# Patient Record
Sex: Male | Born: 1940 | Race: White | Hispanic: Refuse to answer | Marital: Married | Smoking: Never smoker
Health system: Southern US, Community
[De-identification: ages and names within clinical notes are randomized; demographics above are authoritative.]

## PROBLEM LIST (undated history)

## (undated) DIAGNOSIS — C61 Malignant neoplasm of prostate: Secondary | ICD-10-CM

## (undated) DIAGNOSIS — H269 Unspecified cataract: Secondary | ICD-10-CM

## (undated) DIAGNOSIS — K635 Polyp of colon: Secondary | ICD-10-CM

## (undated) DIAGNOSIS — E7800 Pure hypercholesterolemia, unspecified: Secondary | ICD-10-CM

## (undated) HISTORY — DX: Malignant neoplasm of prostate: C61

## (undated) HISTORY — DX: Polyp of colon: K63.5

## (undated) HISTORY — DX: Pure hypercholesterolemia, unspecified: E78.00

## (undated) HISTORY — PX: COLONOSCOPY: SHX174

## (undated) HISTORY — DX: Unspecified cataract: H26.9

## (undated) HISTORY — PX: PROSTATECTOMY: SHX69

## (undated) NOTE — Nursing Note (Signed)
 Formatting of this note might be different from the original.  Discharge note at 10:39 AM  Vital signs stable, I.V. discontinued with cannula tip intact. Results of the procedure discussed, and recommendations given by EMERSON Henri M.D.   .  After-Care Visit Summary (AVS) and verbal discharge instructions reviewed with patient and wife ;  who expressed understanding of all information given. Discharged criteria met and patient was discharged.  Electronically signed by Brigido Gleda LABOR (R.N.), R.N. at 08/09/2015 10:39 AM PDT

## (undated) NOTE — Nursing Note (Signed)
 Formatting of this note might be different from the original.  Completed procedure note  See [facility] flowsheet for detailed information.     Colonoscopy Completed by Dr. Evelene with no Difficulties. Abdominal pressure was used as needed to facilitate movement of the scope. Vital signs maintained within stable range for patient.  Pathology : x2 Jars colon    Medications:   Versed IV mg: 5 MG   Demerol IVmg: 50 MG              Patient transported to post procedure area for recovery. Report given to Nic R., RN Electronically signed by Curlie Rea HAMS (R.N.), R.N. at 08/09/2015  9:24 AM PDT

## (undated) NOTE — Nursing Note (Signed)
 Formatting of this note might be different from the original. 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 )  Seizures/ Brain Injury: no Sleep Apnea or Snoring: no Anesthesia/ Sedation Problems: no Heart Disease: no Lung Disease/ Smoker: no Liver Disease: no Kidney Disease: no Diabetes: no Cancer: yes Bleeding Disorder: no Pregnant or Breast-Feeding: no  PHYSICAL ASSESSMENT 69 year old  Ambulatory (age appropriate): yes Teeth Intact (not loose): yes Neurologic: Alert, age appropriate yes Heart Rate / Pulse  Regular: yes Airway Patent yes  Pertinent Labs/ Diagnostic Tests: na Comments: na  CURRENT MEDS ASA:  no Anticoagulant:  no Antihypertensive:  no NSAID:  no Pain:  no Herbals:  no  Patient was informed that jewelry will not be removed if they decide to wear it.  Electronically signed by: TORY DALLAS RUBIN RN 10/30/2018 10:10 AM Electronically signed by RUBIN TORY DALLAS (R.N.), R.N. at 10/30/2018 10:11 AM PST

## (undated) NOTE — Nursing Note (Signed)
 Formatting of this note might be different from the original. Completed procedure note at 10:41 AM   See [facility] flowsheet for detailed information.  Colonoscopy completed by EMERSON Henri M.D.   with no difficulties. Abdominal pressure by Randall Anderson LVN  and position changes were used as needed to facilitate movement of the scope.Vital signs maintained within stable range for patient.Assisted by Randall Anderson LVN  Pathology : None  Medications: (Propofol  listed below indicates MAC)   Versed IV mg: 5 MG(Total)   Demerol IV mg: 50 MG(Total)            Patient transported to post procedure area for recovery. Report given to RN Electronically signed by Brigido Lazier A (R.N.), R.N. at 10/30/2018 10:44 AM PST

## (undated) NOTE — Progress Notes (Signed)
 Formatting of this note is different from the original. Pre-procedure brief History and Physical  Primary Care Physician: Awanda Cancer (M.D.)  Procedure:  Colon Indication:  Brian Donovan is a 48 year old male presents for evaluation for hx of polyps  Patient seen, examined and chart reviewed.  PMH: [place] PSH: Past Surgical History:  Procedure Laterality Date  ? URETHRAL SLING SUSPENSION MALE  04/25/2012   Laterality: N/A;  uand culture preop cipro ordered ACE   call vivian for fixation study  uro tech;  Surgeon: Eilene Sherwood Salt (M.D.)  ? URETHRAL DILATION     Patient Active Problem List:    GERD    SEBORRHEIC KERATOSIS, NON INFLAMED    ROTATOR CUFF SYNDROME    PAIN IN OR AROUND EYE.    COLON POLYP    FHX OF COLON CANCER    SCREENING FOR COLON CANCER    HX OF PROSTATE CANCER    LUMBAR DISC HERNIATION    OSTEOARTHRITIS    HYPERLIPIDEMIA  Allergies: Sulfa (sulfonamide antibiotics)  MEDICATIONS:  Outpatient Medications Marked as Taking for the 10/30/18 encounter (Procedure Only) with Sitzer, Donnice Sabin (M.D.), M.D.: Aspirin (ECOTRIN LOW STRENGTH) 81 mg Oral TBEC DR Tab 1 TAB PO DAILY Disp: 100 Rfl: 0  Atorvastatin  (LIPITOR) 20 mg Oral Tab Take 1 tablet by mouth daily to reduce risk of heart attacks and strokes Disp: 100 Rfl: 6  Meloxicam  (MOBIC ) 15 mg Oral Tab Take 1 tablet by mouth daily with food as needed for pain or inflammation Disp: 30 Rfl: 3   Current Facility-Administered Medications for the 10/30/18 encounter (Procedure Only) with Sitzer, Donnice Sabin (M.D.), M.D.: Meperidine (PF) Inj 50 mg (DEMEROL) 50 mg intraVENOUS PRN Sitzer, Donnice Sabin (M.D.), M.D.  Sodium Chloride 0.9% IV Premix 250 mL intraVENOUS Continuous Sitzer, Donnice Sabin (M.D.), M.D.  Midazolam Inj 5 mg (VERSED) 5 mg intraVENOUS PRN Sitzer, Donnice Sabin (M.D.), M.D.   Social and Family History were reviewed/ confirmed in the electronic record.  PE:   WD, WN, NAD ASA: 2 Mallampati  Score:2 BP 129/65   Pulse 66   Temp 97.6 F (36.4 C) (Tympanic)   Resp 18   Ht 1.651 m (5' 5)   Wt 85.3 kg (188 lb 0.8 oz)   SpO2 99%   BMI 31.29 kg/m  HEENT: anicteric, OP clear LUNGS: cta bilat CV: normal heart sounds ABD: +bs, soft, NT, ND EXTR: no edema NEURO: alert  WBC'S AUTO      5.7   04/12/2012 HGB            14.5   04/12/2012 HCT AUTO       42.5   04/12/2012 PLT'S AUTO      187   04/12/2012  Pre sedation orders: IV-0.9% NS Titrate IV medication for sedation: Demerol 12.5-25 mg increments  Versed 0.5-2 mg increments Prior to discharge, discontinue IV and provide discharge instructions. After procedure, but no sooner than 30 minutes after last sedation dose, patient may be discharged if criteria met.   PLAN: Proceed with procedure as described above.  All risks (including but not limited to bleeding, perforation, medication reaction, aspiration, and missed lesions), benefits and alternatives have been discussed.  The patient wishes to proceed. Written informed consent was obtained and placed in the patient's medical record. For details of the procedure please see the procedure tab on chart view.  MATTHEW SABIN HENRI MD 10/30/2018, 10:33 AM Electronically signed by HENRI Donnice Sabin (M.D.), M.D. at 10/30/2018  9:07 PM  PST

## (undated) NOTE — Progress Notes (Signed)
 Formatting of this note might be different from the original. History: See Procedure/Path tab under chart review for detailed procedure information. Patient's medications were reviewed.  ROS  PE Text: Not Used  Physical Exam    Electronically signed by Payton, Donna Kippen (N.P.) at 05/31/2007  2:54 PM PDT

## (undated) NOTE — Nursing Note (Signed)
 Formatting of this note might be different from the original.  Discharge note Vital signs stable, I.V. discontinued with cannula tip intact. Results of the procedure discussed, and recommendations given by Dr. Evelene  .  After-Care Visit Summary (AVS) and verbal discharge instructions reviewed with patient and  wife;  who expressed understanding of all information given. Discharged criteria met and patient was discharged. Electronically signed by Lonzo Hodgkins (R.N.), R.N. at 10/30/2018 11:42 AM PST

## (undated) NOTE — Nursing Note (Signed)
 Formatting of this note is different from the original. Pre-Procedure note  Patient education has been completed to include: Sedation medication, Procedure and post procedure. All of the patient?s questions were answered to their satisfaction. The consent was given and signed by the patient.  The time out/ safety stop was performed immediately prior to the procedure with all team present to verify the right patient and procedure. Procedure team at bedside: RN. : DOMINICO A RECTO RN LVN : Randall Anderson LVN        Provider: EMERSON Henri M.D.     PROACTIVE CARE ACTIONS  Proactive Office Encounter Actions: There are no care gaps at this time      Electronically signed by Brigido, Dominico A (R.N.), R.N. at 10/30/2018 10:30 AM PST

## (undated) NOTE — Procedures (Signed)
 Associated Order(s): COLONOSCOPY Procedure(s): COLONOSCOPY W REMOVAL OF LESION USING SNARE Pre-Procedure Diagnose(s): SCREENING COLONOSCOPY Post-Procedure Diagnose(s): SCREENING COLONOSCOPY Formatting of this note might be different from the original. GI PROCEDURE REPORT:  COLONOSCOPY  PATIENT NAME: Brian Donovan MEDICAL RECORD NUMBER: [id number]  PROCEDURE:  Colonoscopy with polypectomy  PRIMARY CARE PROVIDER:  Awanda Cancer (M.D.)  INDICATION:  Primary Indication: Follow up Polyp(s)  DETAILS OF THE PROCEDURE:  Prior to the procedure, the provider and medical staff involved identified and verified with the patient his correct name and procedure.  Briefing and Time Out process occurred immediately before the procedure by verifying correct patient, procedure, plan and appropriate equipment available.  The benefits, risks, and alternatives to the procedure including conscious sedation were discussed and informed consent was obtained from the patient and / or legal guardian.  Based on the pre-procedure assessment, including review of the patient's medical history, medications, allergies, and review of systems, the patient was deemed to be an appropriate candidate for conscious sedation.  He was therefore sedated with the medications listed below.  He was monitored continuously with EKG tracing, pulse oximetry, blood pressure monitoring, and direct observation.   PROCEDURE:  The patient was placed in the left lateral decubitus position.  Rectal examination was performed.  The colonoscope was inserted into the rectum and advanced under direct vision to the cecum confirmed by  the ileocecal valve and appendiceal orifice.  The quality of the colonic preparation was satisfactory.  A careful inspection was made as the colonoscope was withdrawn, including a retroflexed view of the rectum.  Findings and interventions are described below and photodocumentation was obtained.   The patient tolerated the  procedure well, and there were no immediate complications.  He was taken to the recovery area in stable condition.  SEDATION:   Medications: (Propofol listed below indicates MAC) Versed IV mg: 5 MG   Demerol IV mg: 50 MG             Complete Specimens Collection Information: - Specimen 1 Bottle #: 1 Location: Transverse Colon Specimen Type: Polyp Total number of polyps: 2 Polyp Size (mm): less than 5 mm (1-2  less than 5mm, ) Type: Sessile (1-2 sessile) Tissue extracted by: Hot Snare (1-2 hot snare) Source of Spec: Colon Polypectomy - Specimen 2 Bottle #: 2 Location:  (@ 20 cm ) Specimen Type: Polyp Total number of polyps: 1 Polyp Size (mm): 5-9 mm Type: Sessile Tissue extracted by: Hot Snare Source of Spec: Colon Polypectomy   Estimated Amount of Blood Loss: - Estimated Blood Loss: Less than 5 mL    FINDINGS AND INTERVENTIONS:  Tiny sessile polyp in the ascending- fulgurated- nothing left to recover.  2 subtle flat transverse polyps- 4-5 mm each snared with cautery and recovered into #1.  6-7 mm pedunculated polyp at 20 cm snared with cautery and recovered into #2.   Few tiny left sided diverticula.   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  Please call if you do not receive results within 2 weeks. Avoid aspirin and non-steroidal anti-inflammatories for the next 14 days Refer to discharge instruction sheet for additional recommendations.  Future Exam(s): - Consider discontinuation of surveillance if > 7 years old.  Electronically signed by:   DONNICE AZZIE HENRI MD  [facility] [address] [place] [address] [phone] 08/09/2015 9:27 AM  Electronically signed by Evelene Donnice Sabin (M.D.), M.D. at 08/09/2015  5:14 PM PDT

## (undated) NOTE — Nursing Note (Signed)
 Formatting of this note might be different from the original. Recovery note from 10:50  Patient was received in recovery room accompanied by  Dominico, RN via gurney. Report was received and patient was assessed.   I.V infusing freely TKO. Abdomen soft, patient without complaints or ill-effects from procedure. Electronically signed by Lonzo Hodgkins (R.N.), R.N. at 10/30/2018 10:57 AM PST

## (undated) NOTE — Progress Notes (Signed)
 Formatting of this note might be different from the original. History:  ROS  PE Text: Not Used  Physical Exam    Electronically signed by Kolste, Donna K (R.N.) at 05/31/2007  2:54 PM PDT

## (undated) NOTE — Nursing Note (Signed)
 Formatting of this note might be different from the original. PROACTIVE CARE ACTIONS  Proactive Office Encounter Actions: There are no care gaps at this time. History and Assessment  ( RN )  Seizures/ Brain Injury: no Sleep Apnea or Snoring: Yes  Anesthesia/ Sedation Problems: no Heart Disease: no Lung Disease/ Smoker: no Liver Disease: no Kidney Disease: no Diabetes: no Cancer: no Prostate cancer Bleeding Disorder: no Pregnant or Breast-Feeding: no  PHYSICAL ASSESSMENT 3 year old  Ambulatory (age appropriate): yes Teeth Intact (not loose): yes Neurologic: Alert, age appropriate yes Heart Rate / Pulse  Regular: yes Airway Patent yes  Pertinent Labs/ Diagnostic Tests: Patient agreed to have wife  to be in recovery room when the MD come and talk to patient after the procedure.  Comments: None  CURRENT MEDS ASA:  no Anticoagulant:  no Antihypertensive:  no NSAID:  no Pain:  no Herbals:  no  Electronically signed by: DOMINICO A RECTO RN 08/09/2015 8:32 AM   Electronically signed by Brigido Lazier A (R.N.), R.N. at 08/09/2015  8:34 AM PDT

## (undated) NOTE — Progress Notes (Signed)
 Formatting of this note might be different from the original. TELEPHONE APPOINTMENT VISIT  Reason for TAV:  No chief complaint on file.   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  patient desires repeat. No sx.  Fam Hx of CRC - but Dad was 70. Very healthy 47 yro old and motivated to repeat.   ------------------------------------------------------ Assessment/Plan:  patient  HX OF ADENOMATOUS COLONIC POLYPS Ok to book RCOLO.    Time spent with patient or guardian over the phone was 20 minutes.  [name], MD  Electronically signed by Henri Donnice Sabin (M.D.), M.D. at 10/17/2018  9:27 AM PDT

## (undated) NOTE — Nursing Note (Signed)
 Formatting of this note is different from the original. Pre-Procedure note  Dr. Evelene at bedside, the consent was given and signed by patient.  All of the patient?s questions were answered. The time out was done immediately prior to the procedure with all team present, to verify the right patient and procedure.     Electronically signed by Curlie Rea HAMS (R.N.), R.N. at 08/09/2015  9:09 AM PDT

## (undated) NOTE — Procedures (Signed)
 Associated Order(s): COLONOSCOPY Procedure(s): COLONOSCOPY, DIAGNOSTIC FLEXIBLE Pre-Procedure Diagnose(s): SCREENING COLONOSCOPY Post-Procedure Diagnose(s): SCREENING COLONOSCOPY Formatting of this note might be different from the original. GI PROCEDURE REPORT:  COLONOSCOPY  PATIENT NAME: Brian Donovan MEDICAL RECORD NUMBER: [id number]  PROCEDURE:  Standard Colonoscopy  PRIMARY CARE PROVIDER:  Awanda Cancer (M.D.)  INDICATION:  Primary Indication: Follow up Polyp(s)  DETAILS OF THE PROCEDURE:  Prior to the procedure, the provider and medical staff involved identified and verified with the patient his correct name and procedure.  Briefing and Time Out process occurred immediately before the procedure by verifying correct patient, procedure, plan and appropriate equipment available.  The benefits, risks, and alternatives to the procedure including conscious sedation were discussed and informed consent was obtained from the patient and / or legal guardian.  Based on the pre-procedure assessment, including review of the patient's medical history, medications, allergies, and review of systems, the patient was deemed to be an appropriate candidate for conscious sedation.  He was therefore sedated with the medications listed below.  He was monitored continuously with EKG tracing, pulse oximetry, blood pressure monitoring, and direct observation.   PROCEDURE:  The patient was placed in the left lateral decubitus position.  Rectal examination was performed.  The colonoscope was inserted into the rectum and advanced under direct vision to the cecum confirmed by  the ileocecal valve and appendiceal orifice.  The quality of the colonic preparation was satisfactory.  A careful inspection was made as the colonoscope was withdrawn, including a retroflexed view of the rectum.  Findings and interventions are described below and photodocumentation was obtained.   The patient tolerated the procedure well, and  there were no immediate complications.  He was taken to the recovery area in stable condition.  SEDATION:   Medications: (Propofol listed below indicates MAC) Versed IV mg: 5 MG(Total)   Demerol IV mg: 50 MG(Total)             Estimated Amount of Blood Loss: - Estimated Blood Loss: None    FINDINGS AND INTERVENTIONS:  Scattered left sided diverticulosis and internal hemorrhoids. Otherwise normal exam. No interval polyps.   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  Future Exam(s): - Consider discontinuation of surveillance if > 23 years old.  Images available for review at [email or web address] MRN search requires accurate leading zeroes.  Electronically signed by:   DONNICE AZZIE HENRI MD  [facility] [address] O[address] [place] [address] [phone] 10/30/2018 10:45 AM   Electronically signed by HENRI DONNICE AZZIE (M.D.), M.D. at 10/30/2018  9:07 PM PST

## (undated) NOTE — Nursing Note (Signed)
 Formatting of this note might be different from the original. Recovery note at 9:28 AM  Patient was received in recovery room D accompanied by TAMMIE E LEE RN  via gurney. Report was received and patient was assessed.   I.V infusing freely TKO. Abdomen soft, patient without complaints or ill-effects from procedure.  Electronically signed by Brigido Gleda LABOR (R.N.), R.N. at 08/09/2015  9:30 AM PDT

---

## 2002-01-29 DIAGNOSIS — L570 Actinic keratosis: Secondary | ICD-10-CM | POA: Insufficient documentation

## 2002-09-19 DIAGNOSIS — K219 Gastro-esophageal reflux disease without esophagitis: Secondary | ICD-10-CM | POA: Insufficient documentation

## 2007-05-31 DIAGNOSIS — Z800 Family history of malignant neoplasm of digestive organs: Secondary | ICD-10-CM | POA: Insufficient documentation

## 2018-10-30 LAB — HM DEXA SCAN: HM Dexa Scan: -1

## 2018-10-30 LAB — HM COLONOSCOPY

## 2019-11-03 DIAGNOSIS — N182 Chronic kidney disease, stage 2 (mild): Secondary | ICD-10-CM | POA: Insufficient documentation

## 2020-03-25 ENCOUNTER — Ambulatory Visit (INDEPENDENT_AMBULATORY_CARE_PROVIDER_SITE_OTHER): Payer: Medicare Other | Admitting: Osteopathic Medicine

## 2020-03-25 ENCOUNTER — Encounter: Payer: Self-pay | Admitting: Osteopathic Medicine

## 2020-03-25 ENCOUNTER — Other Ambulatory Visit: Payer: Self-pay

## 2020-03-25 VITALS — BP 144/73 | HR 96 | Temp 98.3°F | Ht 64.17 in | Wt 206.5 lb

## 2020-03-25 DIAGNOSIS — Z9079 Acquired absence of other genital organ(s): Secondary | ICD-10-CM | POA: Insufficient documentation

## 2020-03-25 DIAGNOSIS — Z860100 Personal history of colon polyps, unspecified: Secondary | ICD-10-CM

## 2020-03-25 DIAGNOSIS — Z8546 Personal history of malignant neoplasm of prostate: Secondary | ICD-10-CM | POA: Diagnosis not present

## 2020-03-25 DIAGNOSIS — N183 Chronic kidney disease, stage 3 unspecified: Secondary | ICD-10-CM

## 2020-03-25 DIAGNOSIS — G4733 Obstructive sleep apnea (adult) (pediatric): Secondary | ICD-10-CM

## 2020-03-25 DIAGNOSIS — M159 Polyosteoarthritis, unspecified: Secondary | ICD-10-CM

## 2020-03-25 DIAGNOSIS — M150 Primary generalized (osteo)arthritis: Secondary | ICD-10-CM

## 2020-03-25 DIAGNOSIS — M8949 Other hypertrophic osteoarthropathy, multiple sites: Secondary | ICD-10-CM

## 2020-03-25 DIAGNOSIS — E782 Mixed hyperlipidemia: Secondary | ICD-10-CM | POA: Diagnosis not present

## 2020-03-25 HISTORY — DX: Personal history of colonic polyps: Z86.010

## 2020-03-25 HISTORY — DX: Other hypertrophic osteoarthropathy, multiple sites: M89.49

## 2020-03-25 HISTORY — DX: Personal history of colon polyps, unspecified: Z86.0100

## 2020-03-25 HISTORY — DX: Primary generalized (osteo)arthritis: M15.0

## 2020-03-25 HISTORY — DX: Personal history of malignant neoplasm of prostate: Z85.46

## 2020-03-25 HISTORY — DX: Acquired absence of other genital organ(s): Z90.79

## 2020-03-25 HISTORY — DX: Polyosteoarthritis, unspecified: M15.9

## 2020-03-25 HISTORY — DX: Mixed hyperlipidemia: E78.2

## 2020-03-25 MED ORDER — MELOXICAM 15 MG PO TABS: 7.5000 mg | ORAL_TABLET | Freq: Every day | ORAL | 3 refills | Status: DC | PRN

## 2020-03-25 MED ORDER — ATORVASTATIN CALCIUM 20 MG PO TABS: 20.0000 mg | ORAL_TABLET | Freq: Every day | ORAL | 3 refills | Status: DC

## 2020-03-25 NOTE — Progress Notes (Signed)
Brian Donovan is a 79 y.o. male who presents to  [facility]  today, 03/25/20, seeking care for the following: . New to establish . See chart for problem list, MPH, SH, FH, SurgHx - all verified w/ patient      ASSESSMENT & PLAN with other pertinent history/findings:  The primary encounter diagnosis was History of prostate cancer. Diagnoses of History of prostatectomy, History of colon polyps, Mixed hyperlipidemia, Primary osteoarthritis involving multiple joints, Stage 3 chronic kidney disease, unspecified whether stage 3a or 3b CKD, and Obstructive sleep apnea were also pertinent to this visit.   To do:  Records from previous PCP [facility] in [place] MRN [id number]  Refill meds - done  Consider follow-up for neck pain (hc severe arthritis)   Consider changing meds for nocturia (was on Flomax but doesn't take this anymore)  Follow PSA for surveillance   Consider repeat sleep study given snoring/sleep problems   Monitur scalp lesions - appear benign   Exercises given for neck pain and lower back pain   Need colonoscopy records - pt was advised he does not need repeat procedure     Orders Placed This Encounter  Procedures  . CBC  . COMPLETE METABOLIC PANEL WITH GFR  . Lipid panel  . PSA, Total with Reflex to PSA, Free    Meds ordered this encounter  Medications  . meloxicam (MOBIC) 15 MG tablet    Sig: Take 0.5-1 tablets (7.5-15 mg total) by mouth daily as needed for pain.    Dispense:  90 tablet    Refill:  3  . atorvastatin (LIPITOR) 20 MG tablet    Sig: Take 1 tablet (20 mg total) by mouth daily.    Dispense:  90 tablet    Refill:  3       Follow-up instructions: Return in about 6 months (around 09/24/2020) for ANNUAL (call week prior to visit for lab orders).                                         BP (!) 144/73 (BP Location: Left Arm, Patient Position:  Sitting, Cuff Size: Normal)   Pulse 96   Temp 98.3 F (36.8 C) (Oral)   Ht 5' 4.17" (1.63 m)   Wt 206 lb 8 oz (93.7 kg)   SpO2 96%   BMI 35.25 kg/m   Current Meds  Medication Sig  . atorvastatin (LIPITOR) 20 MG tablet Take 1 tablet (20 mg total) by mouth daily.  . Glucosamine-Chondroit-Vit C-Mn (GLUCOSAMINE 1500 COMPLEX) CAPS Take by mouth.  . meloxicam (MOBIC) 15 MG tablet Take 0.5-1 tablets (7.5-15 mg total) by mouth daily as needed for pain.  . Multiple Vitamin (MULTIVITAMIN) tablet Take 1 tablet by mouth daily.  . [DISCONTINUED] atorvastatin (LIPITOR) 20 MG tablet Take 20 mg by mouth daily.  . [DISCONTINUED] meloxicam (MOBIC) 15 MG tablet Take 15 mg by mouth daily.    No results found for this or any previous visit (from the past 72 hour(s)).  No results found.  Depression screen [facility] 2/9 03/25/2020  Decreased Interest 0  Down, Depressed, Hopeless 0  PHQ - 2 Score 0  Altered sleeping 2  Tired, decreased energy 2  Change in appetite 0  Feeling bad or failure about yourself  0  Trouble concentrating 2  Moving slowly or fidgety/restless 0  Suicidal thoughts  0  PHQ-9 Score 6  Difficult doing work/chores Not difficult at all    GAD 7 : Generalized Anxiety Score 03/25/2020  Nervous, Anxious, on Edge 1  Control/stop worrying 0  Worry too much - different things 0  Trouble relaxing 0  Restless 0  Easily annoyed or irritable 1  Afraid - awful might happen 0  Total GAD 7 Score 2  Anxiety Difficulty Not difficult at all      All questions at time of visit were answered - patient instructed to contact office with any additional concerns or updates.  ER/RTC precautions were reviewed with the patient.  Please note: voice recognition software was used to produce this document, and typos may escape review. Please contact Dr. Sheppard Coil for any needed clarifications.   Total encounter time: 45 minutes.

## 2020-04-23 LAB — CBC
HCT: 43.1 % (ref 38.5–50.0)
Hemoglobin: 14.5 g/dL (ref 13.2–17.1)
MCH: 31.3 pg (ref 27.0–33.0)
MCHC: 33.6 g/dL (ref 32.0–36.0)
MCV: 92.9 fL (ref 80.0–100.0)
MPV: 10.7 fL (ref 7.5–12.5)
Platelets: 193 10*3/uL (ref 140–400)
RBC: 4.64 10*6/uL (ref 4.20–5.80)
RDW: 13 % (ref 11.0–15.0)
WBC: 5 10*3/uL (ref 3.8–10.8)

## 2020-04-23 LAB — LIPID PANEL
Cholesterol: 143 mg/dL (ref <200)
HDL: 45 mg/dL (ref >=40)
LDL Cholesterol (Calc): 80 mg/dL (calc)
Non-HDL Cholesterol (Calc): 98 mg/dL (calc) (ref <130)
Total CHOL/HDL Ratio: 3.2 (calc) (ref <5.0)
Triglycerides: 101 mg/dL (ref <150)

## 2020-04-23 LAB — COMPLETE METABOLIC PANEL WITH GFR
AG Ratio: 1.8 (calc) (ref 1.0–2.5)
ALT: 25 U/L (ref 9–46)
AST: 20 U/L (ref 10–35)
Albumin: 4.1 g/dL (ref 3.6–5.1)
Alkaline phosphatase (APISO): 71 U/L (ref 35–144)
BUN: 22 mg/dL (ref 7–25)
CO2: 28 mmol/L (ref 20–32)
Calcium: 8.9 mg/dL (ref 8.6–10.3)
Chloride: 106 mmol/L (ref 98–110)
Creat: 1.13 mg/dL (ref 0.70–1.18)
GFR, Est African American: 72 mL/min/{1.73_m2} (ref >=60)
GFR, Est Non African American: 62 mL/min/{1.73_m2} (ref >=60)
Globulin: 2.3 g/dL (calc) (ref 1.9–3.7)
Glucose, Bld: 101 mg/dL — ABNORMAL HIGH (ref 65–99)
Potassium: 4.3 mmol/L (ref 3.5–5.3)
Sodium: 139 mmol/L (ref 135–146)
Total Bilirubin: 0.8 mg/dL (ref 0.2–1.2)
Total Protein: 6.4 g/dL (ref 6.1–8.1)

## 2020-04-23 LAB — PSA, TOTAL WITH REFLEX TO PSA, FREE: PSA, Total: <0.1 ng/mL (ref <=4.0)

## 2020-09-23 ENCOUNTER — Ambulatory Visit (INDEPENDENT_AMBULATORY_CARE_PROVIDER_SITE_OTHER): Payer: Medicare Other | Admitting: Osteopathic Medicine

## 2020-09-23 ENCOUNTER — Other Ambulatory Visit: Payer: Self-pay

## 2020-09-23 ENCOUNTER — Encounter: Payer: Self-pay | Admitting: Osteopathic Medicine

## 2020-09-23 ENCOUNTER — Ambulatory Visit: Payer: Medicare Other | Admitting: Osteopathic Medicine

## 2020-09-23 VITALS — BP 118/65 | HR 54 | Temp 98.0°F | Wt 193.0 lb

## 2020-09-23 DIAGNOSIS — R002 Palpitations: Secondary | ICD-10-CM

## 2020-09-23 DIAGNOSIS — Z8546 Personal history of malignant neoplasm of prostate: Secondary | ICD-10-CM | POA: Diagnosis not present

## 2020-09-23 DIAGNOSIS — R4189 Other symptoms and signs involving cognitive functions and awareness: Secondary | ICD-10-CM

## 2020-09-23 DIAGNOSIS — N183 Chronic kidney disease, stage 3 unspecified: Secondary | ICD-10-CM

## 2020-09-23 DIAGNOSIS — Z23 Encounter for immunization: Secondary | ICD-10-CM | POA: Diagnosis not present

## 2020-09-23 DIAGNOSIS — E782 Mixed hyperlipidemia: Secondary | ICD-10-CM

## 2020-09-23 DIAGNOSIS — F339 Major depressive disorder, recurrent, unspecified: Secondary | ICD-10-CM

## 2020-09-23 NOTE — Progress Notes (Addendum)
Brian Donovan is a 79 y.o. male who presents to  [place] at [facility]  today, 09/23/20, seeking care for the following:  Initially scheduled for Medicare wellness but problems as below were brought up that we spent most of the time addressing.  . Memory concerns -noticing that he is more forgetful, wife is noticing as well. . Mental health -has been some conflict lately with family members, he and his wife are working on it but they have not been getting along as well as they usually do. He would be interested in speaking with a counselor . Due for routine labs     ASSESSMENT & PLAN with other pertinent findings:  The primary encounter diagnosis was Cognitive impairment. Diagnoses of Flu vaccine need, History of prostate cancer, Stage 3 chronic kidney disease, unspecified whether stage 3a or 3b CKD ([place]), Mixed hyperlipidemia, and Palpitations were also pertinent to this visit.   No results found for this or any previous visit (from the past 24 hour(s)).  1. Flu vaccine need Done today  2. History of prostate cancer PSA pending  3. Stage 3 chronic kidney disease, unspecified whether stage 3a or 3b CKD ([facility]) CMP, CBC pending  4. Mixed hyperlipidemia Lipids pending  5. Cognitive impairment Based on description, I do not suspect serious disease but this certainly may be early signs/symptoms of dementia. Labs pending for metabolic causes of memory issues. May consider MRI/neurocognitive evaluation but I also think that mental health plays a big role here and I would like to address depression type symptoms.  6. Palpitations Patient reports that every few months he feels a bit of a twinge in the chest, no chest pain, pressure, shortness of breath.    There are no Patient Instructions on file for this visit.  Orders Placed This Encounter  Procedures  . Flu Vaccine QUAD High Dose(Fluad)  . CBC  . COMPLETE METABOLIC PANEL WITH  GFR  . TSH  . Vitamin B12    No orders of the defined types were placed in this encounter.      Follow-up instructions: Return for RECHECK PENDING RESULTS / IF WORSE OR CHANGE.                                         BP 118/65 (BP Location: Left Arm, Patient Position: Sitting)   Pulse (!) 54   Temp 98 F (36.7 C)   Wt 193 lb (87.5 kg)   SpO2 98%   BMI 32.95 kg/m   Current Meds  Medication Sig  . atorvastatin (LIPITOR) 20 MG tablet Take 1 tablet (20 mg total) by mouth daily.  . Glucosamine-Chondroit-Vit C-Mn (GLUCOSAMINE 1500 COMPLEX) CAPS Take by mouth.  . meloxicam (MOBIC) 15 MG tablet Take 0.5-1 tablets (7.5-15 mg total) by mouth daily as needed for pain.  . Multiple Vitamin (MULTIVITAMIN) tablet Take 1 tablet by mouth daily.    Results for orders placed or performed in visit on 09/23/20 (from the past 72 hour(s))  CBC     Status: None   Collection Time: 09/23/20  3:40 PM  Result Value Ref Range   WBC 5.5 3.8 - 10.8 Thousand/uL   RBC 4.77 4.20 - 5.80 Million/uL   Hemoglobin 14.5 13.2 - 17.1 g/dL   HCT 43.9 38 - 50 %   MCV 92.0 80.0 - 100.0 fL   MCH 30.4  27.0 - 33.0 pg   MCHC 33.0 32.0 - 36.0 g/dL   RDW 12.8 11.0 - 15.0 %   Platelets 212 140 - 400 Thousand/uL   MPV 10.6 7.5 - 12.5 fL  COMPLETE METABOLIC PANEL WITH GFR     Status: None   Collection Time: 09/23/20  3:40 PM  Result Value Ref Range   Glucose, Bld 91 65 - 99 mg/dL    Comment: .            Fasting reference interval .    BUN 19 7 - 25 mg/dL   Creat 1.14 0.70 - 1.18 mg/dL    Comment: For patients >22 years of age, the reference limit for Creatinine is approximately 13% higher for people identified as African-American. .    GFR, Est Non African American 61 > OR = 60 mL/min/1.62m2   GFR, Est African American 70 > OR = 60 mL/min/1.31m2   BUN/Creatinine Ratio NOT APPLICABLE 6 - 22 (calc)   Sodium 139 135 - 146 mmol/L   Potassium 4.5 3.5 - 5.3 mmol/L    Chloride 104 98 - 110 mmol/L   CO2 28 20 - 32 mmol/L   Calcium 9.3 8.6 - 10.3 mg/dL   Total Protein 6.4 6.1 - 8.1 g/dL   Albumin 4.1 3.6 - 5.1 g/dL   Globulin 2.3 1.9 - 3.7 g/dL (calc)   AG Ratio 1.8 1.0 - 2.5 (calc)   Total Bilirubin 1.0 0.2 - 1.2 mg/dL   Alkaline phosphatase (APISO) 65 35 - 144 U/L   AST 21 10 - 35 U/L   ALT 19 9 - 46 U/L  TSH     Status: None   Collection Time: 09/23/20  3:40 PM  Result Value Ref Range   TSH 2.72 0.40 - 4.50 mIU/L  Vitamin B12     Status: None   Collection Time: 09/23/20  3:40 PM  Result Value Ref Range   Vitamin B-12 637 200 - 1,100 pg/mL    No results found.     All questions at time of visit were answered - patient instructed to contact office with any additional concerns or updates.  ER/RTC precautions were reviewed with the patient as applicable.   Please note: voice recognition software was used to produce this document, and typos may escape review. Please contact Dr. Sheppard Coil for any needed clarifications.   Total encounter time: 30 minutes.

## 2020-09-24 LAB — COMPLETE METABOLIC PANEL WITH GFR
AG Ratio: 1.8 (calc) (ref 1.0–2.5)
ALT: 19 U/L (ref 9–46)
AST: 21 U/L (ref 10–35)
Albumin: 4.1 g/dL (ref 3.6–5.1)
Alkaline phosphatase (APISO): 65 U/L (ref 35–144)
BUN: 19 mg/dL (ref 7–25)
CO2: 28 mmol/L (ref 20–32)
Calcium: 9.3 mg/dL (ref 8.6–10.3)
Chloride: 104 mmol/L (ref 98–110)
Creat: 1.14 mg/dL (ref 0.70–1.18)
GFR, Est African American: 70 mL/min/{1.73_m2} (ref >=60)
GFR, Est Non African American: 61 mL/min/{1.73_m2} (ref >=60)
Globulin: 2.3 g/dL (calc) (ref 1.9–3.7)
Glucose, Bld: 91 mg/dL (ref 65–99)
Potassium: 4.5 mmol/L (ref 3.5–5.3)
Sodium: 139 mmol/L (ref 135–146)
Total Bilirubin: 1 mg/dL (ref 0.2–1.2)
Total Protein: 6.4 g/dL (ref 6.1–8.1)

## 2020-09-24 LAB — CBC
HCT: 43.9 % (ref 38.5–50.0)
Hemoglobin: 14.5 g/dL (ref 13.2–17.1)
MCH: 30.4 pg (ref 27.0–33.0)
MCHC: 33 g/dL (ref 32.0–36.0)
MCV: 92 fL (ref 80.0–100.0)
MPV: 10.6 fL (ref 7.5–12.5)
Platelets: 212 10*3/uL (ref 140–400)
RBC: 4.77 10*6/uL (ref 4.20–5.80)
RDW: 12.8 % (ref 11.0–15.0)
WBC: 5.5 10*3/uL (ref 3.8–10.8)

## 2020-09-24 LAB — VITAMIN B12: Vitamin B-12: 637 pg/mL (ref 200–1100)

## 2020-09-24 LAB — TSH: TSH: 2.72 mIU/L (ref 0.40–4.50)

## 2020-09-24 NOTE — Addendum Note (Signed)
Addended by: Maryla Morrow on: 09/24/2020 04:08 PM   Modules accepted: Orders

## 2021-03-31 ENCOUNTER — Other Ambulatory Visit: Payer: Self-pay | Admitting: Osteopathic Medicine

## 2021-04-01 ENCOUNTER — Ambulatory Visit: Payer: Medicare Other

## 2021-04-05 ENCOUNTER — Telehealth: Payer: Self-pay

## 2021-04-05 DIAGNOSIS — N183 Chronic kidney disease, stage 3 unspecified: Secondary | ICD-10-CM

## 2021-04-05 DIAGNOSIS — E782 Mixed hyperlipidemia: Secondary | ICD-10-CM

## 2021-04-05 DIAGNOSIS — Z0000 Encounter for general adult medical examination without abnormal findings: Secondary | ICD-10-CM

## 2021-04-05 NOTE — Telephone Encounter (Signed)
Labs good - signed orders

## 2021-04-05 NOTE — Telephone Encounter (Signed)
Pt called stating that he has a MWV with provider. Requesting annual labs. Does provider want to add on add'l testing?

## 2021-04-06 NOTE — Telephone Encounter (Signed)
Task completed. Pt's wife Santiago Glad) has been updated of annual fasting labs. [facility] schedule/hours provided to patient.

## 2021-04-08 ENCOUNTER — Other Ambulatory Visit: Payer: Self-pay

## 2021-04-08 ENCOUNTER — Ambulatory Visit (INDEPENDENT_AMBULATORY_CARE_PROVIDER_SITE_OTHER): Payer: Medicare Other | Admitting: Medical-Surgical

## 2021-04-08 ENCOUNTER — Telehealth: Payer: Self-pay | Admitting: Osteopathic Medicine

## 2021-04-08 VITALS — BP 138/65 | HR 60 | Ht 65.0 in | Wt 202.1 lb

## 2021-04-08 DIAGNOSIS — Z0000 Encounter for general adult medical examination without abnormal findings: Secondary | ICD-10-CM | POA: Diagnosis not present

## 2021-04-08 DIAGNOSIS — Z23 Encounter for immunization: Secondary | ICD-10-CM | POA: Diagnosis not present

## 2021-04-08 LAB — COMPLETE METABOLIC PANEL WITH GFR
AG Ratio: 1.8 (calc) (ref 1.0–2.5)
ALT: 25 U/L (ref 9–46)
AST: 22 U/L (ref 10–35)
Albumin: 4.1 g/dL (ref 3.6–5.1)
Alkaline phosphatase (APISO): 73 U/L (ref 35–144)
BUN/Creatinine Ratio: 28 (calc) — ABNORMAL HIGH (ref 6–22)
BUN: 30 mg/dL — ABNORMAL HIGH (ref 7–25)
CO2: 29 mmol/L (ref 20–32)
Calcium: 8.9 mg/dL (ref 8.6–10.3)
Chloride: 104 mmol/L (ref 98–110)
Creat: 1.08 mg/dL (ref 0.70–1.18)
GFR, Est African American: 75 mL/min/{1.73_m2} (ref >=60)
GFR, Est Non African American: 65 mL/min/{1.73_m2} (ref >=60)
Globulin: 2.3 g/dL (calc) (ref 1.9–3.7)
Glucose, Bld: 88 mg/dL (ref 65–99)
Potassium: 4.6 mmol/L (ref 3.5–5.3)
Sodium: 138 mmol/L (ref 135–146)
Total Bilirubin: 0.7 mg/dL (ref 0.2–1.2)
Total Protein: 6.4 g/dL (ref 6.1–8.1)

## 2021-04-08 LAB — LIPID PANEL W/REFLEX DIRECT LDL
Cholesterol: 136 mg/dL (ref <200)
HDL: 47 mg/dL (ref >=40)
LDL Cholesterol (Calc): 71 mg/dL (calc)
Non-HDL Cholesterol (Calc): 89 mg/dL (calc) (ref <130)
Total CHOL/HDL Ratio: 2.9 (calc) (ref <5.0)
Triglycerides: 95 mg/dL (ref <150)

## 2021-04-08 LAB — CBC
HCT: 44.8 % (ref 38.5–50.0)
Hemoglobin: 14.9 g/dL (ref 13.2–17.1)
MCH: 31.3 pg (ref 27.0–33.0)
MCHC: 33.3 g/dL (ref 32.0–36.0)
MCV: 94.1 fL (ref 80.0–100.0)
MPV: 10.7 fL (ref 7.5–12.5)
Platelets: 206 10*3/uL (ref 140–400)
RBC: 4.76 10*6/uL (ref 4.20–5.80)
RDW: 12.9 % (ref 11.0–15.0)
WBC: 5.4 10*3/uL (ref 3.8–10.8)

## 2021-04-08 NOTE — Telephone Encounter (Signed)
Patient stated he has been out of medication listed below for a week or more and needs a refill.  atorvastatin (LIPITOR) 20 MG tablet

## 2021-04-08 NOTE — Telephone Encounter (Signed)
He is due for follow-up.  A refill for 30 tablets was sent on 03/31/2021 to his pharmacy.

## 2021-04-08 NOTE — Progress Notes (Signed)
MEDICARE ANNUAL WELLNESS VISIT  04/08/2021   Subjective:  Brian Donovan is a 80 y.o. male patient of Emeterio Reeve, DO who had a Medicare Annual Wellness Visit today. Brian Donovan is Retired and lives with their spouse. Brian Donovan has 3 children. Brian Donovan reports that Brian Donovan is socially active and does interact with friends/family regularly. Brian Donovan is minimally physically active and enjoys yardwork.  Patient Care Team: Emeterio Reeve, DO as PCP - General (Osteopathic Medicine)  Advanced Directives 04/08/2021  Does Patient Have a Medical Advance Directive? Yes  Type of Paramedic of [place];Living will  Does patient want to make changes to medical advance directive? No - Patient declined  Copy of [place] in Chart? No - copy requested    Hospital Utilization Over the Past 12 Months: # of hospitalizations or ER visits: 0 # of surgeries: 0  Review of Systems    Patient reports that his overall health is unchanged when compared to last year.  Review of Systems: History obtained from chart review and the patient  All other systems negative.  Pain Assessment Pain : No/denies pain     Current Medications & Allergies (verified) Allergies as of 04/08/2021   No Known Allergies     Medication List       Accurate as of April 08, 2021 11:05 AM. If you have any questions, ask your nurse or doctor.        atorvastatin 20 MG tablet Commonly known as: LIPITOR Take 1 tablet (20 mg total) by mouth daily. **PATIENT NEEDS OFFICE VISIT FOR ADDITIONAL REFILLS**   Glucosamine 1500 Complex Caps Take by mouth.   meloxicam 15 MG tablet Commonly known as: MOBIC Take 0.5-1 tablets (7.5-15 mg total) by mouth daily as needed for pain.   multivitamin tablet Take 1 tablet by mouth daily.       History (reviewed): Past Medical History:  Diagnosis Date  . Colon polyps   . High cholesterol   . History of colon polyps 03/25/2020  . History of prostate cancer  03/25/2020  . History of prostatectomy 03/25/2020  . Mixed hyperlipidemia 03/25/2020  . 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 Prostate cancer [facility])    Past Surgical History:  Procedure Laterality Date  . PROSTATECTOMY     Family History  Problem Relation Age of Onset  . Cancer Father   . Prostate cancer Father   . Colon cancer Father    Social History   Socioeconomic History  . Marital status: Married    Spouse name: Brian Donovan  . Number of children: 3  . Years of education: 18  . Highest education level: Associate degree: academic program  Occupational History  . Occupation: Retired  Tobacco Use  . Smoking status: Never Smoker  . Smokeless tobacco: Never Used  Substance and Sexual Activity  . Alcohol use: Yes    Comment: Rare  . Drug use: Not Currently  . Sexual activity: Yes    Partners: Female  Other Topics Concern  . Not on file  Social History Narrative   Lives with his wife. They have three children. Brian Donovan likes to do yardwork in his free time.    Social Determinants of Health   Financial Resource Strain: Low Risk   . Difficulty of Paying Living Expenses: Not hard at all  Food Insecurity: No Food Insecurity  . Worried About Charity fundraiser in the Last Year: Never true  . Ran Out of Food in the Last Year:  Never true  Transportation Needs: No Transportation Needs  . Lack of Transportation (Medical): No  . Lack of Transportation (Non-Medical): No  Physical Activity: Inactive  . Days of Exercise per Week: 0 days  . Minutes of Exercise per Session: 0 min  Stress: No Stress Concern Present  . Feeling of Stress : Only a little  Social Connections: Moderately Integrated  . Frequency of Communication with Friends and Family: Twice a week  . Frequency of Social Gatherings with Friends and Family: Twice a week  . Attends Religious Services: More than 4 times per year  . Active Member of Clubs or Organizations: No  . Attends Theatre manager Meetings: Never  . Marital Status: Married    Activities of Daily Living In your present state of health, do you have any difficulty performing the following activities: 04/08/2021  Hearing? N  Vision? N  Difficulty concentrating or making decisions? Y  Comment sometimes remembering the name of the restaurant.  Walking or climbing stairs? N  Dressing or bathing? N  Doing errands, shopping? N  Preparing Food and eating ? N  Using the Toilet? N  In the past six months, have you accidently leaked urine? Y  Comment Brian Donovan has had a prostatectomy.  Do you have problems with loss of bowel control? N  Managing your Medications? N  Managing your Finances? N  Housekeeping or managing your Housekeeping? N  Some recent data might be hidden    Patient Education/Literacy How often do you need to have someone help you when you read instructions, pamphlets, or other written materials from your doctor or pharmacy?: 1 - Never What is the last grade level you completed in school?: Associates Degree  Exercise Current Exercise Habits: The patient does not participate in regular exercise at present, Exercise limited by: None identified  Diet Patient reports consuming 3 meals a day and 2 snack(s) a day Patient reports that his primary diet is: Regular Patient reports that she does have regular access to food.   Depression Screen PHQ 2/9 Scores 04/08/2021 03/25/2020  PHQ - 2 Score 0 0  PHQ- 9 Score - 6     Fall Risk Fall Risk  04/08/2021 03/25/2020  Falls in the past year? 0 0  Number falls in past yr: 0 0  Injury with Fall? 0 0  Risk for fall due to : No Fall Risks -  Follow up Falls evaluation completed -     Objective:   BP 138/65 (BP Location: Left Arm, Patient Position: Sitting, Cuff Size: Normal)   Pulse 60   Ht 5\' 5"  (1.651 m)   Wt 202 lb 1.3 oz (91.7 kg)   SpO2 100%   BMI 33.63 kg/m   Last Weight  Most recent update: 04/08/2021  9:52 AM   Weight  91.7 kg (202 lb 1.3  oz)            Body mass index is 33.63 kg/m.  Hearing/Vision  . Brian Donovan did not have difficulty with hearing/understanding during the face-to-face interview . Brian Donovan did not have difficulty with his vision during the face-to-face interview . Reports that Brian Donovan has had a formal eye exam by an eye care professional within the past year . Reports that Brian Donovan has not had a formal hearing evaluation within the past year  Cognitive Function: 6CIT Screen 04/08/2021  What Year? 0 points  What month? 0 points  What time? 0 points  Count back from 20 0 points  Months in  reverse 0 points  Repeat phrase 0 points  Total Score 0    Normal Cognitive Function Screening: Yes (Normal:0-7, Significant for Dysfunction: >8)  Immunization & Health Maintenance Record Immunization History  Administered Date(s) Administered  . Fluad Quad(high Dose 65+) 09/23/2020  . PFIZER(Purple Top)SARS-COV-2 Vaccination 02/09/2020, 03/01/2020, 09/24/2020  . PNEUMOCOCCAL CONJUGATE-20 04/08/2021    Health Maintenance  Topic Date Due  . PNA vac Low Risk Adult (1 of 2 - PCV13) 09/14/2006  . TETANUS/TDAP  04/08/2022 (Originally 09/14/1960)  . Hepatitis C Screening  04/08/2022 (Originally [birth year])  . INFLUENZA VACCINE  07/18/2021  . COLONOSCOPY (Pts 45-58yrs Insurance coverage will need to be confirmed)  05/18/2024  . COVID-19 Vaccine  Completed  . HPV VACCINES  Aged Out       Assessment  This is a routine wellness examination for Brian Donovan.  Health Maintenance: Due or Overdue Health Maintenance Due  Topic Date Due  . PNA vac Low Risk Adult (1 of 2 - PCV13) 09/14/2006    Brian Donovan does not need a referral for [employer] Assistance: Care Management:   no Social Work:    no Prescription Assistance:  no Nutrition/Diabetes Education:  no   Plan:  Personalized Goals Goals Addressed              This Visit's Progress   .  Patient Stated (pt-stated)        04/08/2021 AWV Goal: Exercise for  General Health   Patient will verbalize understanding of the benefits of increased physical activity:  Exercising regularly is important. It will improve your overall fitness, flexibility, and endurance.  Regular exercise also will improve your overall health. It can help you control your weight, reduce stress, and improve your bone density.  Over the next year, patient will increase physical activity as tolerated with a goal of at least 150 minutes of moderate physical activity per week.   You can tell that you are exercising at a moderate intensity if your heart starts beating faster and you start breathing faster but can still hold a conversation.  Moderate-intensity exercise ideas include:  Walking 1 mile (1.6 km) in about 15 minutes  Biking  Hiking  Golfing  Dancing  Water aerobics  Patient will verbalize understanding of everyday activities that increase physical activity by providing examples like the following: ? Yard work, such as: ? Pushing a Conservation officer, nature ? Raking and bagging leaves ? Washing your car ? Pushing a stroller ? Shoveling snow ? Gardening ? Washing windows or floors  Patient will be able to explain general safety guidelines for exercising:   Before you start a new exercise program, talk with your health care provider.  Do not exercise so much that you hurt yourself, feel dizzy, or get very short of breath.  Wear comfortable clothes and wear shoes with good support.  Drink plenty of water while you exercise to prevent dehydration or heat stroke.  Work out until your breathing and your heartbeat get faster.       Personalized Health Maintenance & Screening Recommendations  Pneumococcal vaccine  Td vaccine Shingrix  Lung Cancer Screening Recommended: no (Low Dose CT Chest recommended if Age 58-80 years, 30 pack-year currently smoking OR have quit w/in past 15 years) Hepatitis C Screening recommended: yes HIV Screening recommended:  yes  Advanced Directives: Written information was not given per the patient's request.  Referrals & Orders Orders Placed This Encounter  Procedures  . Pneumococcal conjugate vaccine 20-valent (Prevnar 20)  Follow-up Plan . Follow-up with Emeterio Reeve, DO as planned . Medicare wellness in one year.   I have personally reviewed and noted the following in the patient's chart:   . Medical and social history . Use of alcohol, tobacco or illicit drugs  . Current medications and supplements . Functional ability and status . Nutritional status . Physical activity . Advanced directives . List of other physicians . Hospitalizations, surgeries, and ER visits in previous 12 months . Vitals . Screenings to include cognitive, depression, and falls . Referrals and appointments  In addition, I have reviewed and discussed with patient certain preventive protocols, quality metrics, and best practice recommendations. A written personalized care plan for preventive services as well as general preventive health recommendations were provided to patient.     Brian Gens, RN  04/08/2021

## 2021-04-08 NOTE — Telephone Encounter (Signed)
See below

## 2021-04-08 NOTE — Patient Instructions (Addendum)
[place] Maintenance Summary and Written Plan of Care  [name] ,  Thank you for allowing me to perform your Medicare Annual Wellness Visit and for your ongoing commitment to your health.   Health Maintenance & Immunization History Health Maintenance  Topic Date Due  . TETANUS/TDAP  04/08/2022 (Originally 09/14/1960)  . Hepatitis C Screening  04/08/2022 (Originally [birth year])  . PNA vac Low Risk Adult (1 of 2 - PCV13) 04/08/2022 (Originally 09/14/2006)  . INFLUENZA VACCINE  07/18/2021  . COLONOSCOPY (Pts 45-79yrs Insurance coverage will need to be confirmed)  05/18/2024  . COVID-19 Vaccine  Completed  . HPV VACCINES  Aged Out   Immunization History  Administered Date(s) Administered  . Fluad Quad(high Dose 65+) 09/23/2020  . PFIZER(Purple Top)SARS-COV-2 Vaccination 02/09/2020, 03/01/2020, 09/24/2020    These are the patient goals that we discussed: Goals Addressed              This Visit's Progress   .  Patient Stated (pt-stated)        04/08/2021 AWV Goal: Exercise for General Health   Patient will verbalize understanding of the benefits of increased physical activity:  Exercising regularly is important. It will improve your overall fitness, flexibility, and endurance.  Regular exercise also will improve your overall health. It can help you control your weight, reduce stress, and improve your bone density.  Over the next year, patient will increase physical activity as tolerated with a goal of at least 150 minutes of moderate physical activity per week.   You can tell that you are exercising at a moderate intensity if your heart starts beating faster and you start breathing faster but can still hold a conversation.  Moderate-intensity exercise ideas include:  Walking 1 mile (1.6 km) in about 15 minutes  Biking  Hiking  Golfing  Dancing  Water aerobics  Patient will verbalize understanding of everyday activities that  increase physical activity by providing examples like the following: ? Yard work, such as: ? Pushing a Conservation officer, nature ? Raking and bagging leaves ? Washing your car ? Pushing a stroller ? Shoveling snow ? Gardening ? Washing windows or floors  Patient will be able to explain general safety guidelines for exercising:   Before you start a new exercise program, talk with your health care provider.  Do not exercise so much that you hurt yourself, feel dizzy, or get very short of breath.  Wear comfortable clothes and wear shoes with good support.  Drink plenty of water while you exercise to prevent dehydration or heat stroke.  Work out until your breathing and your heartbeat get faster.         This is a list of Health Maintenance Items that are overdue or due now: There are no preventive care reminders to display for this patient.   Orders/Referrals Placed Today: Orders Placed This Encounter  Procedures  . Pneumococcal conjugate vaccine 20-valent (Prevnar 20)   (Contact our referral department at [phone] if you have not spoken with someone about your referral appointment within the next 5 days)    Follow-up Plan . Follow-up with Emeterio Reeve, DO as planned . Medicare wellness in one year.    Fall Prevention in the Home, Adult Falls can cause injuries and can happen to people of all ages. There are many things you can do to make your home safe and to help prevent falls. Ask for help when making these changes. What actions can I take to prevent falls? General  Instructions  Use good lighting in all rooms. Replace any light bulbs that burn out.  Turn on the lights in dark areas. Use night-lights.  Keep items that you use often in easy-to-reach places. Lower the shelves around your home if needed.  Set up your furniture so you have a clear path. Avoid moving your furniture around.  Do not have throw rugs or other things on the floor that can make you  trip.  Avoid walking on wet floors.  If any of your floors are uneven, fix them.  Add color or contrast paint or tape to clearly mark and help you see: ? Grab bars or handrails. ? First and last steps of staircases. ? Where the edge of each step is.  If you use a stepladder: ? Make sure that it is fully opened. Do not climb a closed stepladder. ? Make sure the sides of the stepladder are locked in place. ? Ask someone to hold the stepladder while you use it.  Know where your pets are when moving through your home. What can I do in the bathroom?  Keep the floor dry. Clean up any water on the floor right away.  Remove soap buildup in the tub or shower.  Use nonskid mats or decals on the floor of the tub or shower.  Attach bath mats securely with double-sided, nonslip rug tape.  If you need to sit down in the shower, use a plastic, nonslip stool.  Install grab bars by the toilet and in the tub and shower. Do not use towel bars as grab bars.      What can I do in the bedroom?  Make sure that you have a light by your bed that is easy to reach.  Do not use any sheets or blankets for your bed that hang to the floor.  Have a firm chair with side arms that you can use for support when you get dressed. What can I do in the kitchen?  Clean up any spills right away.  If you need to reach something above you, use a step stool with a grab bar.  Keep electrical cords out of the way.  Do not use floor polish or wax that makes floors slippery. What can I do with my stairs?  Do not leave any items on the stairs.  Make sure that you have a light switch at the top and the bottom of the stairs.  Make sure that there are handrails on both sides of the stairs. Fix handrails that are broken or loose.  Install nonslip stair treads on all your stairs.  Avoid having throw rugs at the top or bottom of the stairs.  Choose a carpet that does not hide the edge of the steps on the  stairs.  Check carpeting to make sure that it is firmly attached to the stairs. Fix carpet that is loose or worn. What can I do on the outside of my home?  Use bright outdoor lighting.  Fix the edges of walkways and driveways and fix any cracks.  Remove anything that might make you trip as you walk through a door, such as a raised step or threshold.  Trim any bushes or trees on paths to your home.  Check to see if handrails are loose or broken and that both sides of all steps have handrails.  Install guardrails along the edges of any raised decks and porches.  Clear paths of anything that can make you trip, such  as tools or rocks.  Have leaves, snow, or ice cleared regularly.  Use sand or salt on paths during winter.  Clean up any spills in your garage right away. This includes grease or oil spills. What other actions can I take?  Wear shoes that: ? Have a low heel. Do not wear high heels. ? Have rubber bottoms. ? Feel good on your feet and fit well. ? Are closed at the toe. Do not wear open-toe sandals.  Use tools that help you move around if needed. These include: ? Canes. ? Walkers. ? Scooters. ? Crutches.  Review your medicines with your doctor. Some medicines can make you feel dizzy. This can increase your chance of falling. Ask your doctor what else you can do to help prevent falls. Where to find more information  Centers for Disease Control and Prevention, STEADI: http://www.wolf.info/  National Institute on Aging: http://kim-miller.com/ Contact a doctor if:  You are afraid of falling at home.  You feel weak, drowsy, or dizzy at home.  You fall at home. Summary  There are many simple things that you can do to make your home safe and to help prevent falls.  Ways to make your home safe include removing things that can make you trip and installing grab bars in the bathroom.  Ask for help when making these changes in your home. This information is not intended to replace  advice given to you by your health care provider. Make sure you discuss any questions you have with your health care provider. Document Revised: 07/07/2020 Document Reviewed: 07/07/2020 Elsevier Patient Education  [place].

## 2021-04-10 ENCOUNTER — Other Ambulatory Visit: Payer: Self-pay | Admitting: Osteopathic Medicine

## 2021-04-27 ENCOUNTER — Encounter: Payer: Self-pay | Admitting: Osteopathic Medicine

## 2021-04-27 ENCOUNTER — Telehealth (INDEPENDENT_AMBULATORY_CARE_PROVIDER_SITE_OTHER): Payer: Medicare Other | Admitting: Osteopathic Medicine

## 2021-04-27 DIAGNOSIS — E782 Mixed hyperlipidemia: Secondary | ICD-10-CM | POA: Diagnosis not present

## 2021-04-27 MED ORDER — MELOXICAM 15 MG PO TABS: 7.5000 mg | ORAL_TABLET | Freq: Every day | ORAL | 3 refills | Status: DC | PRN

## 2021-04-27 MED ORDER — ATORVASTATIN CALCIUM 20 MG PO TABS: 20.0000 mg | ORAL_TABLET | Freq: Every day | ORAL | 3 refills | Status: DC

## 2021-04-27 NOTE — Progress Notes (Signed)
Telemedicine Visit via  Video & Audio (App used: Deloris Ping)   I connected with Brian Donovan on 04/27/21 at 10:37 AM  by phone or  telemedicine application as noted above  I verified that I am speaking with or regarding  the correct patient using two identifiers.  Participants: Myself, Dr Emeterio Reeve DO Patient: Brian Donovan [place] Patient proxy if applicable: NONE Other, if applicable: NONE  Patient is at home I am in office at [facility]    I discussed the limitations of evaluation and management  by telemedicine and the availability of in person appointments.  The participant(s) above expressed understanding and  agreed to proceed with this appointment via telemedicine.       History of Present Illness: Brian Donovan is a 80 y.o. male who would like to discuss med refill and lab question. He was told he needed viist for refill on atorvastatin. Not the case - lipids recently were ok. Had question about slight elevation in BUN and BUN/Cr ratio on labs. Lab results reviewed w/ patient.          Observations/Objective: There were no vitals taken for this visit. BP Readings from Last 3 Encounters:  04/08/21 138/65  09/23/20 118/65  03/25/20 (!) 144/73   Exam: Normal Speech.  NAD  Lab and Radiology Results No results found for this or any previous visit (from the past 72 hour(s)). No results found.     Assessment and Plan: 80 y.o. male with The encounter diagnosis was Mixed hyperlipidemia.   PDMP not reviewed this encounter. No orders of the defined types were placed in this encounter.  Meds ordered this encounter  Medications  . atorvastatin (LIPITOR) 20 MG tablet    Sig: Take 1 tablet (20 mg total) by mouth daily.    Dispense:  90 tablet    Refill:  3  . meloxicam (MOBIC) 15 MG tablet    Sig: Take 0.5-1 tablets (7.5-15 mg total) by mouth daily as needed for pain.    Dispense:  90 tablet    Refill:  3   There are no Patient  Instructions on file for this visit.  Instructions sent via MyChart.   Follow Up Instructions: Return in about 1 year (around 04/27/2022).    I discussed the assessment and treatment plan with the patient. The patient was provided an opportunity to ask questions and all were answered. The patient agreed with the plan and demonstrated an understanding of the instructions.   The patient was advised to call back or seek an in-person evaluation if any new concerns, if symptoms worsen or if the condition fails to improve as anticipated.  10 minutes of non-face-to-face time was provided during this encounter.      . . . . . . . . . . . . . Marland Kitchen                   Historical information moved to improve visibility of documentation.  Past Medical History:  Diagnosis Date  . Colon polyps   . High cholesterol   . History of colon polyps 03/25/2020  . History of prostate cancer 03/25/2020  . History of prostatectomy 03/25/2020  . Mixed hyperlipidemia 03/25/2020  . 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 Prostate cancer [facility])    Past Surgical History:  Procedure Laterality Date  . PROSTATECTOMY     Social History   Tobacco Use  . Smoking status: Never Smoker  . Smokeless tobacco: Never Used  Substance Use Topics  . Alcohol use: Yes    Comment: Rare   family history includes Cancer in his father; Colon cancer in his father; Prostate cancer in his father.  Medications: Current Outpatient Medications  Medication Sig Dispense Refill  . Glucosamine-Chondroit-Vit C-Mn (GLUCOSAMINE 1500 COMPLEX) CAPS Take by mouth.    . Multiple Vitamin (MULTIVITAMIN) tablet Take 1 tablet by mouth daily.    Marland Kitchen atorvastatin (LIPITOR) 20 MG tablet Take 1 tablet (20 mg total) by mouth daily. 90 tablet 3  . meloxicam (MOBIC) 15 MG tablet Take 0.5-1 tablets (7.5-15 mg total) by mouth daily as needed for pain. 90 tablet 3   No current facility-administered medications  for this visit.   No Known Allergies   If phone visit, billing and coding can please add appropriate modifier if needed

## 2021-04-29 ENCOUNTER — Ambulatory Visit (INDEPENDENT_AMBULATORY_CARE_PROVIDER_SITE_OTHER): Payer: Medicare Other | Admitting: Sports Medicine

## 2021-04-29 ENCOUNTER — Other Ambulatory Visit: Payer: Self-pay

## 2021-04-29 ENCOUNTER — Ambulatory Visit (INDEPENDENT_AMBULATORY_CARE_PROVIDER_SITE_OTHER): Payer: Medicare Other

## 2021-04-29 DIAGNOSIS — M47816 Spondylosis without myelopathy or radiculopathy, lumbar region: Secondary | ICD-10-CM

## 2021-04-29 DIAGNOSIS — M4722 Other spondylosis with radiculopathy, cervical region: Secondary | ICD-10-CM

## 2021-04-29 MED ORDER — PREDNISONE 50 MG PO TABS: ORAL_TABLET | ORAL | 0 refills | Status: DC

## 2021-04-29 MED ORDER — GABAPENTIN 300 MG PO CAPS: 300.0000 mg | ORAL_CAPSULE | Freq: Every day | ORAL | 3 refills | Status: DC

## 2021-04-29 NOTE — Progress Notes (Signed)
    Procedures performed today:    None.  Independent interpretation of notes and tests performed by another provider:   None.  Brief History, Exam, Impression, and Recommendations:    Cervical spondylosis with radiculopathy This is a pleasant 80 year old male with a long history of neck pain with radiation down the right arm to the right hand with numbness and tingling. He has had chiropractic manipulation in the past in [place] that seem to work pretty well. I do think we are dealing with classic cervical radiculopathy, multifactorial. Adding 5 days of prednisone, gabapentin at night, cervical spine x-rays and referral to Dr. Belenda Cruise for chiropractic manipulation. Return to see me in 6 weeks, MRI for interventional planning if no better.  Lumbar spondylosis Alva also has axial low back pain, worse in the right buttock and worse with walking consistent with lumbar spinal stenosis. As above we will get some x-rays, do some prednisone, gabapentin, as well as manipulation. Return to see me in 6 weeks, MRI for interventional planning if no better.    ___________________________________________ Gwen Her. Dianah Field, M.D., ABFM., CAQSM. Primary Care and [place] Instructor of [place] of [facility] of Medicine

## 2021-04-29 NOTE — Assessment & Plan Note (Signed)
Mrk also has axial low back pain, worse in the right buttock and worse with walking consistent with lumbar spinal stenosis. As above we will get some x-rays, do some prednisone, gabapentin, as well as manipulation. Return to see me in 6 weeks, MRI for interventional planning if no better.

## 2021-04-29 NOTE — Assessment & Plan Note (Signed)
This is a pleasant 80 year old male with a long history of neck pain with radiation down the right arm to the right hand with numbness and tingling. He has had chiropractic manipulation in the past in [place] that seem to work pretty well. I do think we are dealing with classic cervical radiculopathy, multifactorial. Adding 5 days of prednisone, gabapentin at night, cervical spine x-rays and referral to Dr. Belenda Cruise for chiropractic manipulation. Return to see me in 6 weeks, MRI for interventional planning if no better.

## 2021-05-26 ENCOUNTER — Telehealth: Payer: Self-pay

## 2021-05-26 NOTE — Telephone Encounter (Signed)
Patient wanted to know if he was supposed to continue the gabapentin. Advised patient that it was prescribed for pain radiating down his right arm with numbness and tingling. He reports this is now gone. Advised him he could choose to stop med to see of symptoms return. Patient aware of this option and will make choice. He has refills available if symptoms return and if he needs to restart med.

## 2021-05-26 NOTE — Telephone Encounter (Signed)
That sounds like a good plan.

## 2021-05-27 ENCOUNTER — Telehealth: Payer: Self-pay

## 2021-05-27 NOTE — Telephone Encounter (Signed)
Patient called with concerns with his eyes. Per pt, his eyes are red, watery and has a grainy feeling. Requesting a referral or a visit with provider. Please contact the patient to schedule an appointment for an evaluation. Thanks in advance.

## 2021-05-30 NOTE — Telephone Encounter (Signed)
Pt made an appt for tomorrow. -tvt

## 2021-05-31 ENCOUNTER — Other Ambulatory Visit: Payer: Self-pay

## 2021-05-31 ENCOUNTER — Ambulatory Visit (INDEPENDENT_AMBULATORY_CARE_PROVIDER_SITE_OTHER): Payer: Medicare Other | Admitting: Osteopathic Medicine

## 2021-05-31 VITALS — BP 134/68 | HR 60 | Temp 98.7°F | Wt 212.0 lb

## 2021-05-31 DIAGNOSIS — H1013 Acute atopic conjunctivitis, bilateral: Secondary | ICD-10-CM | POA: Diagnosis not present

## 2021-05-31 MED ORDER — OLOPATADINE HCL 0.2 % OP SOLN: 1.0000 [drp] | Freq: Two times a day (BID) | OPHTHALMIC | 3 refills | Status: DC

## 2021-05-31 NOTE — Patient Instructions (Signed)
Try eye drops  If helping, ok to continue through allergy season If not helping or if worse/change, will refer to ophthalmology  OK to take gabapentin as needed / long term

## 2021-05-31 NOTE — Progress Notes (Signed)
Brian Donovan is a 80 y.o. male who presents to  [place] at [facility]  today, 05/31/21, seeking care for the following:  Eye problem: redness, eyes feel "grainy." Ongoing a few weeks. Has tried eye lubricant OTC, looked this up it's mostly purified water.  Also had some questions about continuing gabapentin or coming off this. Taking 300 mg qhs most days      ASSESSMENT & PLAN with other pertinent findings:  The encounter diagnosis was Allergic conjunctivitis of both eyes.    Patient Instructions  Try eye drops  If helping, ok to continue through allergy season If not helping or if worse/change, will refer to ophthalmology  OK to take gabapentin as needed / long term    No orders of the defined types were placed in this encounter.   Meds ordered this encounter  Medications   Olopatadine HCl (PATADAY) 0.2 % SOLN    Sig: Apply 1-2 drops to eye in the morning and at bedtime.    Dispense:  5 mL    Refill:  3     See below for relevant physical exam findings  See below for recent lab and imaging results reviewed  Medications, allergies, PMH, PSH, SocH, FamH reviewed below    Follow-up instructions: Return if symptoms worsen or fail to improve.                                        Exam:  BP 134/68 (BP Location: Left Arm, Patient Position: Sitting, Cuff Size: Large)   Pulse 60   Temp 98.7 F (37.1 C) (Oral)   Wt 212 lb (96.2 kg)   BMI 35.28 kg/m  Constitutional: VS see above. General Appearance: alert, well-developed, well-nourished, NAD EYES: EOMI, PERRL, no conjunctival edema/erythema, lids normal bilaterally  Neck: No masses, trachea midline.  Respiratory: Normal respiratory effort. n Musculoskeletal: Gait normal. Symmetric and independent movement of all extremities Neurological: Normal balance/coordination. No tremor. Skin: warm, dry, intact.  Psychiatric: Normal  judgment/insight. Normal mood and affect. Oriented x3.   Current Meds  Medication Sig   atorvastatin (LIPITOR) 20 MG tablet Take 1 tablet (20 mg total) by mouth daily.   gabapentin (NEURONTIN) 300 MG capsule Take 1 capsule (300 mg total) by mouth at bedtime.   Glucosamine-Chondroit-Vit C-Mn (GLUCOSAMINE 1500 COMPLEX) CAPS Take by mouth.   meloxicam (MOBIC) 15 MG tablet Take 0.5-1 tablets (7.5-15 mg total) by mouth daily as needed for pain.   Multiple Vitamin (MULTIVITAMIN) tablet Take 1 tablet by mouth daily.   Olopatadine HCl (PATADAY) 0.2 % SOLN Apply 1-2 drops to eye in the morning and at bedtime.   [DISCONTINUED] predniSONE (DELTASONE) 50 MG tablet One tab PO daily for 5 days.    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    Family History  Problem Relation Age of Onset   Cancer Father    Prostate cancer Father    Colon cancer Father     Social History   Tobacco Use  Smoking Status Never  Smokeless Tobacco Never    Past Surgical History:  Procedure Laterality Date   PROSTATECTOMY      Immunization History  Administered Date(s) Administered   Fluad Quad(high Dose 65+) 09/23/2020   PFIZER(Purple Top)SARS-COV-2 Vaccination 02/09/2020, 03/01/2020, 09/24/2020   PNEUMOCOCCAL CONJUGATE-20 04/08/2021    Recent Results (from the past 2160 hour(s))  CBC     Status: None   Collection Time: 04/07/21 12:00 AM  Result Value Ref Range   WBC 5.4 3.8 - 10.8 Thousand/uL   RBC 4.76 4.20 - 5.80 Million/uL   Hemoglobin 14.9 13.2 - 17.1 g/dL   HCT 44.8 38.5 - 50.0 %   MCV 94.1 80.0 - 100.0 fL   MCH 31.3 27.0 - 33.0 pg   MCHC 33.3 32.0 - 36.0 g/dL   RDW  12.9 11.0 - 15.0 %   Platelets 206 140 - 400 Thousand/uL   MPV 10.7 7.5 - 12.5 fL  Lipid Panel w/reflex Direct LDL     Status: None   Collection Time: 04/07/21 12:00 AM  Result Value Ref Range   Cholesterol 136 <200 mg/dL   HDL 47 > OR = 40 mg/dL   Triglycerides 95 <150 mg/dL   LDL Cholesterol (Calc) 71 mg/dL (calc)    Comment: Reference range: <100 . Desirable range <100 mg/dL for primary prevention;   <70 mg/dL for patients with CHD or diabetic patients  with > or = 2 CHD risk factors. Marland Kitchen LDL-C is now calculated using the Martin-Hopkins  calculation, which is a validated novel method providing  better accuracy than the Friedewald equation in the  estimation of LDL-C.  Cresenciano Genre et al. Annamaria Helling. 2353;614(43): 2061-2068  (http://education.[facility].com/faq/FAQ164)    Total CHOL/HDL Ratio 2.9 <5.0 (calc)   Non-HDL Cholesterol (Calc) 89 <130 mg/dL (calc)    Comment: For patients with diabetes plus 1 major ASCVD risk  factor, treating to a non-HDL-C goal of <100 mg/dL  (LDL-C of <70 mg/dL) is considered a therapeutic  option.   COMPLETE METABOLIC PANEL WITH GFR     Status: Abnormal   Collection Time: 04/07/21 12:00 AM  Result Value Ref Range   Glucose, Bld 88 65 - 99 mg/dL    Comment: .            Fasting reference interval .    BUN 30 (H) 7 - 25 mg/dL   Creat 1.08 0.70 - 1.18 mg/dL    Comment: For patients >50 years of age, the reference limit for Creatinine is approximately 13% higher for people identified as African-American. .    GFR, Est Non African American 65 > OR = 60 mL/min/1.61m2   GFR, Est African American 75 > OR = 60 mL/min/1.22m2   BUN/Creatinine Ratio 28 (H) 6 - 22 (calc)   Sodium 138 135 - 146 mmol/L   Potassium 4.6 3.5 - 5.3 mmol/L   Chloride 104 98 - 110 mmol/L   CO2 29 20 - 32 mmol/L   Calcium 8.9 8.6 - 10.3 mg/dL   Total Protein 6.4 6.1 - 8.1 g/dL   Albumin 4.1 3.6 - 5.1 g/dL   Globulin 2.3 1.9 - 3.7 g/dL (calc)   AG Ratio 1.8 1.0 - 2.5  (calc)   Total Bilirubin 0.7 0.2 - 1.2 mg/dL   Alkaline phosphatase (APISO) 73 35 - 144 U/L   AST 22 10 - 35 U/L   ALT 25 9 - 46 U/L    No results found.     All questions at time of visit were answered - patient instructed to contact office with any additional concerns  or updates. ER/RTC precautions were reviewed with the patient as applicable.   Please note: manual typing as well as voice recognition software may have been used to produce this document - typos may escape review. Please contact Dr. Sheppard Coil for any needed clarifications.

## 2021-06-10 ENCOUNTER — Other Ambulatory Visit: Payer: Self-pay

## 2021-06-10 ENCOUNTER — Ambulatory Visit (INDEPENDENT_AMBULATORY_CARE_PROVIDER_SITE_OTHER): Payer: Medicare Other | Admitting: Sports Medicine

## 2021-06-10 DIAGNOSIS — M4722 Other spondylosis with radiculopathy, cervical region: Secondary | ICD-10-CM | POA: Diagnosis not present

## 2021-06-10 DIAGNOSIS — M47816 Spondylosis without myelopathy or radiculopathy, lumbar region: Secondary | ICD-10-CM

## 2021-06-10 NOTE — Progress Notes (Signed)
    Procedures performed today:    None.  Independent interpretation of notes and tests performed by another provider:   None.  Brief History, Exam, Impression, and Recommendations:    Cervical spondylosis with radiculopathy Brian Donovan is a pleasant 80 year old male, long history of neck pain right-sided radiculitis, we treated him aggressively with prednisone, gabapentin, C-spine x-rays, he never did manipulation. He is overall doing really well, continues his gabapentin at night. I would like him to do cervical spondylosis rehab exercises and he can call me back for either a burst of prednisone or additional treatment in the future if needed.  Lumbar spondylosis As above Brian Donovan also has axial low back pain worse on the right buttock and worse with walking all consistent with lumbar spinal stenosis, x-rays were suggestive, the above treatment was highly effective, adding lumbar spine conditioning exercises, return as needed.    ___________________________________________ Gwen Her. Dianah Field, M.D., ABFM., CAQSM. Primary Care and [place] Instructor of [place] of [facility] of Medicine

## 2021-06-10 NOTE — Assessment & Plan Note (Signed)
Brian Donovan is a pleasant 80 year old male, long history of neck pain right-sided radiculitis, we treated him aggressively with prednisone, gabapentin, C-spine x-rays, he never did manipulation. He is overall doing really well, continues his gabapentin at night. I would like him to do cervical spondylosis rehab exercises and he can call me back for either a burst of prednisone or additional treatment in the future if needed.

## 2021-06-10 NOTE — Assessment & Plan Note (Signed)
As above Brian Donovan also has axial low back pain worse on the right buttock and worse with walking all consistent with lumbar spinal stenosis, x-rays were suggestive, the above treatment was highly effective, adding lumbar spine conditioning exercises, return as needed.

## 2021-06-14 ENCOUNTER — Other Ambulatory Visit: Payer: Self-pay

## 2021-06-14 ENCOUNTER — Ambulatory Visit (INDEPENDENT_AMBULATORY_CARE_PROVIDER_SITE_OTHER): Payer: Medicare Other | Admitting: Sports Medicine

## 2021-06-14 ENCOUNTER — Ambulatory Visit (INDEPENDENT_AMBULATORY_CARE_PROVIDER_SITE_OTHER): Payer: Medicare Other

## 2021-06-14 DIAGNOSIS — S4992XA Unspecified injury of left shoulder and upper arm, initial encounter: Secondary | ICD-10-CM | POA: Insufficient documentation

## 2021-06-14 MED ORDER — HYDROCODONE-ACETAMINOPHEN 5-325 MG PO TABS: 1.0000 | ORAL_TABLET | Freq: Three times a day (TID) | ORAL | 0 refills | Status: DC | PRN

## 2021-06-14 NOTE — Assessment & Plan Note (Signed)
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 I think he likely has a rotator cuff strain, adding a short course of hydrocodone, sling, x-rays, return to see me in 2 weeks and we will consider ultrasound or advanced imaging if not significantly better.

## 2021-06-14 NOTE — Progress Notes (Signed)
    Procedures performed today:    None.  Independent interpretation of notes and tests performed by another provider:   None.  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 I think he likely has a rotator cuff strain, adding a short course of hydrocodone, sling, x-rays, return to see me in 2 weeks and we will consider ultrasound or advanced imaging if not significantly better.   ___________________________________________ Gwen Her. Dianah Field, M.D., ABFM., CAQSM. Primary Care and [place] Instructor of [place] of [facility]

## 2021-06-27 ENCOUNTER — Encounter: Payer: Self-pay | Admitting: Sports Medicine

## 2021-06-27 ENCOUNTER — Other Ambulatory Visit: Payer: Self-pay

## 2021-06-27 ENCOUNTER — Ambulatory Visit (INDEPENDENT_AMBULATORY_CARE_PROVIDER_SITE_OTHER): Payer: Medicare Other | Admitting: Sports Medicine

## 2021-06-27 DIAGNOSIS — S4992XD Unspecified injury of left shoulder and upper arm, subsequent encounter: Secondary | ICD-10-CM | POA: Diagnosis not present

## 2021-06-27 NOTE — Assessment & Plan Note (Signed)
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 He had pain over his deltoid, good passive motion, no discomfort with palpation, but weakness to abduction and external rotation. I suspected a rotator cuff strain, we had a short course of hydrocodone, 2 weeks of a sling, rotator cuff conditioning exercises and an x-ray. X-ray was negative for fracture, he is doing significantly better today with excellent motion, excellent strength and only trace discomfort, return to see me on an as-needed basis, continue rotator cuff exercises indefinitely.

## 2021-06-27 NOTE — Progress Notes (Signed)
    Procedures performed today:    None.  Independent interpretation of notes and tests performed by another provider:   None.  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 He had pain over his deltoid, good passive motion, no discomfort with palpation, but weakness to abduction and external rotation. I suspected a rotator cuff strain, we had a short course of hydrocodone, 2 weeks of a sling, rotator cuff conditioning exercises and an x-ray. X-ray was negative for fracture, he is doing significantly better today with excellent motion, excellent strength and only trace discomfort, return to see me on an as-needed basis, continue rotator cuff exercises indefinitely.    ___________________________________________ Gwen Her. Dianah Field, M.D., ABFM., CAQSM. Primary Care and [place] Instructor of [place] of [facility] of Medicine

## 2021-06-28 ENCOUNTER — Telehealth (INDEPENDENT_AMBULATORY_CARE_PROVIDER_SITE_OTHER): Payer: Medicare Other | Admitting: Osteopathic Medicine

## 2021-06-28 ENCOUNTER — Encounter: Payer: Self-pay | Admitting: Osteopathic Medicine

## 2021-06-28 VITALS — Temp 97.9°F

## 2021-06-28 DIAGNOSIS — H04129 Dry eye syndrome of unspecified lacrimal gland: Secondary | ICD-10-CM | POA: Diagnosis not present

## 2021-06-28 DIAGNOSIS — R258 Other abnormal involuntary movements: Secondary | ICD-10-CM

## 2021-06-28 DIAGNOSIS — R1319 Other dysphagia: Secondary | ICD-10-CM | POA: Diagnosis not present

## 2021-06-28 DIAGNOSIS — R682 Dry mouth, unspecified: Secondary | ICD-10-CM | POA: Diagnosis not present

## 2021-06-28 DIAGNOSIS — F515 Nightmare disorder: Secondary | ICD-10-CM

## 2021-06-28 NOTE — Progress Notes (Signed)
Problems with his eyes watering a lot, feels like there's sand in them. Vision doesn't seem clear. Pataday drops help a little but hasn't cleared up the problem. Patient reports he was waking up with a little headache that went away by itself. Wife reports that he is combative in his sleep which is new in the last month. They are concerned that there is something neurological going on.

## 2021-06-28 NOTE — Progress Notes (Signed)
Telemedicine Visit via  Audio only - telephone (patient preference /  technical difficulty with MyChart video application)  I connected with Brian Donovan on 06/28/21 at 10:31 AM  by phone or  telemedicine application as noted above  I verified that I am speaking with or regarding  the correct patient using two identifiers.  Participants: Myself, Dr Emeterio Reeve DO Patient: Brian Donovan Other, if applicable: wife, Brian Donovan  Patient is at home I am in office at [facility]    I discussed the limitations of evaluation and management  by telemedicine and the availability of in person appointments.  The participant(s) above expressed understanding and  agreed to proceed with this appointment via telemedicine.       History of Present Illness: Brian Donovan is a 80 y.o. male who would like to discuss eye dryness and sleep problem   Problems with his eyes watering a lot, feels like there's sand in them. Vision doesn't seem clear. Pataday drops help a little but hasn't cleared up the problem. No dark spots in vision. Last formal vision exam last 08/2020 for glasses. Eye issues going on about 2 months at this point, the issue seems stable. Mild dry mouth. Reports mucus-type feeling in throat.   Patient reports he was waking up with a little headache that went away by itself. Wife reports that he is combative in his sleep which is new in the last month. Has hit he rin his sleep. He reports nightmares. No history RLS. They are concerned that there is something neurological going on. On gabapentin for arthritis issues, which also help the insomnia        Observations/Objective: Temp 97.9 F (36.6 C) (Temporal)  BP Readings from Last 3 Encounters:  05/31/21 134/68  04/08/21 138/65  09/23/20 118/65   Exam: Normal Speech.  NAD  Lab and Radiology Results No results found for this or any previous visit (from the past 72 hour(s)). No results  found.     Assessment and Plan: 80 y.o. male with The primary encounter diagnosis was Dry eye. Diagnoses of Dry mouth, Esophageal dysphagia, Nightmare disorder, and Nocturnal leg movements were also pertinent to this visit.  Dry eye and mouth w/ mild occasional dysphagia concerning for possible sjogrens' or similar, will get lab panel as below. Ddx also includes allergic conjunctivitis, lacrimal duct and/or salivary dict d/o, other GI cause for dysphagia, postnasal drip / allergy symptoms. Ophtho referral pending for second opinion  Nighttime movement issue - he is already on gabapentin, "hyperkinetic muscle activity" is listed as potential AE but this seems unlikely? Possible this is due to nightmares, offered doxepin assuming all labs ok, would also consider treatment for RLS variant / limb movement d/o, no red flags but would have low threshold fore referral to neurology      PDMP not reviewed this encounter. Orders Placed This Encounter  Procedures   CBC with Differential/Platelet   COMPLETE METABOLIC PANEL WITH GFR   ANA   ANCA screen with reflex titer   High sensitivity CRP   Rheumatoid factor   Sedimentation rate   Fe+TIBC+Fer   Ambulatory referral to Ophthalmology    Referral Priority:   Routine    Referral Type:   Consultation    Referral Reason:   Specialty Services Required    Requested Specialty:   Ophthalmology    Number of Visits Requested:   1   No orders of the defined types were placed in this encounter.  There are no Patient  Instructions on file for this visit.  Instructions sent via MyChart.   Follow Up Instructions: No follow-ups on file.    I discussed the assessment and treatment plan with the patient. The patient was provided an opportunity to ask questions and all were answered. The patient agreed with the plan and demonstrated an understanding of the instructions.   The patient was advised to call back or seek an in-person evaluation if any new  concerns, if symptoms worsen or if the condition fails to improve as anticipated.  30 minutes of non-face-to-face time was provided during this encounter.      . . . . . . . . . . . . . Marland Kitchen                   Historical information moved to improve visibility of documentation.  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  Medications: Current Outpatient Medications  Medication Sig Dispense Refill   atorvastatin (LIPITOR) 20 MG tablet Take 1 tablet (20 mg total) by mouth daily. 90 tablet 3   gabapentin (NEURONTIN) 300 MG capsule Take 1 capsule (300 mg total) by mouth at bedtime. 30 capsule 3   Glucosamine-Chondroit-Vit C-Mn (GLUCOSAMINE 1500 COMPLEX) CAPS Take by mouth.     meloxicam (MOBIC) 15 MG tablet Take 0.5-1 tablets (7.5-15 mg total) by mouth daily as needed for pain. 90 tablet 3   Multiple Vitamin (MULTIVITAMIN) tablet Take 1 tablet by mouth daily.     Olopatadine HCl (PATADAY) 0.2 % SOLN Apply 1-2 drops to eye in the morning and at bedtime. 5 mL 3   HYDROcodone-acetaminophen (NORCO/VICODIN) 5-325 MG tablet Take 1 tablet by mouth every 8 (eight) hours as needed for moderate pain. (Patient not taking: Reported on 06/28/2021) 15 tablet 0   No current facility-administered medications for this visit.   No Known Allergies   If phone visit, billing and coding can please add  appropriate modifier if needed

## 2021-07-01 LAB — COMPLETE METABOLIC PANEL WITH GFR
AG Ratio: 1.9 (calc) (ref 1.0–2.5)
ALT: 25 U/L (ref 9–46)
AST: 23 U/L (ref 10–35)
Albumin: 4.2 g/dL (ref 3.6–5.1)
Alkaline phosphatase (APISO): 77 U/L (ref 35–144)
BUN: 22 mg/dL (ref 7–25)
CO2: 26 mmol/L (ref 20–32)
Calcium: 9.1 mg/dL (ref 8.6–10.3)
Chloride: 106 mmol/L (ref 98–110)
Creat: 1.08 mg/dL (ref 0.70–1.28)
Globulin: 2.2 g/dL (calc) (ref 1.9–3.7)
Glucose, Bld: 92 mg/dL (ref 65–99)
Potassium: 4.7 mmol/L (ref 3.5–5.3)
Sodium: 138 mmol/L (ref 135–146)
Total Bilirubin: 0.9 mg/dL (ref 0.2–1.2)
Total Protein: 6.4 g/dL (ref 6.1–8.1)
eGFR: 70 mL/min/{1.73_m2} (ref >=60)

## 2021-07-01 LAB — RHEUMATOID FACTOR: Rheumatoid fact SerPl-aCnc: <14 IU/mL (ref <14)

## 2021-07-01 LAB — CBC WITH DIFFERENTIAL/PLATELET
Absolute Monocytes: 645 cells/uL (ref 200–950)
Basophils Absolute: 60 cells/uL (ref 0–200)
Basophils Relative: 1.2 %
Eosinophils Absolute: 90 cells/uL (ref 15–500)
Eosinophils Relative: 1.8 %
HCT: 43 % (ref 38.5–50.0)
Hemoglobin: 14.3 g/dL (ref 13.2–17.1)
Lymphs Abs: 1420 cells/uL (ref 850–3900)
MCH: 30.3 pg (ref 27.0–33.0)
MCHC: 33.3 g/dL (ref 32.0–36.0)
MCV: 91.1 fL (ref 80.0–100.0)
MPV: 10.6 fL (ref 7.5–12.5)
Monocytes Relative: 12.9 %
Neutro Abs: 2785 cells/uL (ref 1500–7800)
Neutrophils Relative %: 55.7 %
Platelets: 206 10*3/uL (ref 140–400)
RBC: 4.72 10*6/uL (ref 4.20–5.80)
RDW: 12.7 % (ref 11.0–15.0)
Total Lymphocyte: 28.4 %
WBC: 5 10*3/uL (ref 3.8–10.8)

## 2021-07-01 LAB — IRON,TIBC AND FERRITIN PANEL
%SAT: 30 % (ref 20–48)
Ferritin: 300 ng/mL (ref 24–380)
Iron: 84 ug/dL (ref 50–180)
TIBC: 281 ug/dL (ref 250–425)

## 2021-07-01 LAB — ANTI-NUCLEAR AB-TITER (ANA TITER): ANA Titer 1: 1:80 {titer} — ABNORMAL HIGH

## 2021-07-01 LAB — HIGH SENSITIVITY CRP: hs-CRP: 3.1 mg/L — ABNORMAL HIGH

## 2021-07-01 LAB — SEDIMENTATION RATE: Sed Rate: 6 mm/h (ref 0–20)

## 2021-07-01 LAB — ANCA SCREEN W REFLEX TITER: ANCA Screen: NEGATIVE

## 2021-07-01 LAB — ANA: Anti Nuclear Antibody (ANA): POSITIVE — AB

## 2021-07-01 MED ORDER — PRAZOSIN HCL 1 MG PO CAPS: 1.0000 mg | ORAL_CAPSULE | Freq: Every day | ORAL | 0 refills | Status: DC

## 2021-07-01 NOTE — Addendum Note (Signed)
Addended by: Maryla Morrow on: 07/01/2021 09:39 AM   Modules accepted: Orders

## 2021-09-07 ENCOUNTER — Other Ambulatory Visit: Payer: Self-pay | Admitting: Sports Medicine

## 2021-09-07 DIAGNOSIS — M4722 Other spondylosis with radiculopathy, cervical region: Secondary | ICD-10-CM

## 2021-09-16 ENCOUNTER — Ambulatory Visit: Payer: Medicare Other

## 2021-10-04 ENCOUNTER — Ambulatory Visit (INDEPENDENT_AMBULATORY_CARE_PROVIDER_SITE_OTHER): Payer: Medicare Other | Admitting: Family Medicine

## 2021-10-04 ENCOUNTER — Other Ambulatory Visit: Payer: Self-pay | Admitting: Sports Medicine

## 2021-10-04 DIAGNOSIS — Z23 Encounter for immunization: Secondary | ICD-10-CM | POA: Diagnosis not present

## 2021-10-04 DIAGNOSIS — M4722 Other spondylosis with radiculopathy, cervical region: Secondary | ICD-10-CM

## 2021-11-08 ENCOUNTER — Other Ambulatory Visit: Payer: Self-pay | Admitting: Sports Medicine

## 2021-11-08 DIAGNOSIS — M4722 Other spondylosis with radiculopathy, cervical region: Secondary | ICD-10-CM

## 2021-12-18 HISTORY — PX: EYE SURGERY: SHX253

## 2021-12-20 ENCOUNTER — Other Ambulatory Visit: Payer: Self-pay | Admitting: Osteopathic Medicine

## 2021-12-20 DIAGNOSIS — R258 Other abnormal involuntary movements: Secondary | ICD-10-CM

## 2021-12-20 DIAGNOSIS — F515 Nightmare disorder: Secondary | ICD-10-CM

## 2022-02-21 ENCOUNTER — Other Ambulatory Visit: Payer: Self-pay | Admitting: Sports Medicine

## 2022-02-21 DIAGNOSIS — M4722 Other spondylosis with radiculopathy, cervical region: Secondary | ICD-10-CM

## 2022-03-20 ENCOUNTER — Other Ambulatory Visit: Payer: Self-pay | Admitting: Sports Medicine

## 2022-03-20 DIAGNOSIS — M4722 Other spondylosis with radiculopathy, cervical region: Secondary | ICD-10-CM

## 2022-04-10 ENCOUNTER — Ambulatory Visit (INDEPENDENT_AMBULATORY_CARE_PROVIDER_SITE_OTHER): Payer: Medicare Other | Admitting: Family Medicine

## 2022-04-10 VITALS — BP 148/81 | HR 57 | Ht 65.0 in | Wt 199.1 lb

## 2022-04-10 DIAGNOSIS — Z0000 Encounter for general adult medical examination without abnormal findings: Secondary | ICD-10-CM

## 2022-04-10 NOTE — Progress Notes (Signed)
?MEDICARE ANNUAL WELLNESS VISIT ? ?04/10/2022 ? ?Subjective:  ?Brian Donovan is a 81 y.o. male patient of No primary care provider on file. who had a [employer] Visit today. Ember is Retired and lives with their spouse. he has 3 children. he reports that he is socially active and does interact with friends/family regularly. he is minimally physically active and enjoys working in his yard. ? ?No care team member to display ? ? ?  04/10/2022  ? 10:39 AM 04/08/2021  ?  9:59 AM  ?Advanced Directives  ?Does Patient Have a Medical Advance Directive? Yes Yes  ?Type of Advance Directive Living will [place];Living will  ?Does patient want to make changes to medical advance directive? No - Patient declined No - Patient declined  ?Copy of [place] in Chart?  No - copy requested  ? ? ?Hospital Utilization Over the Past 12 Months: ?# of hospitalizations or ER visits: 0 ?# of surgeries: 0 ? ?Review of Systems    ?Patient reports that his overall health is unchanged when compared to last year. ? ?Review of Systems: ?History obtained from chart review and the patient ? ?All other systems negative. ? ?Pain Assessment ?Pain : 0-10 ?Pain Score: 2  ?Pain Type: Chronic pain ?Pain Location: Hip ?Pain Orientation: Right ?Pain Descriptors / Indicators: Discomfort ?Pain Onset: More than a month ago ?Pain Frequency: Intermittent ?Pain Relieving Factors: walking ? ?Pain Relieving Factors: walking ? ?Current Medications & Allergies (verified) ?Allergies as of 04/10/2022   ?No Known Allergies ?  ? ?  ?Medication List  ?  ? ?  ? Accurate as of April 10, 2022 11:00 AM. If you have any questions, ask your nurse or doctor.  ?  ?  ? ?  ? ?atorvastatin 20 MG tablet ?Commonly known as: LIPITOR ?Take 1 tablet (20 mg total) by mouth daily. ?  ?gabapentin 300 MG capsule ?Commonly known as: NEURONTIN ?Take 1 capsule by mouth at bedtime ?  ?Glucosamine 1500 Complex Caps ?Take by mouth. ?   ?HYDROcodone-acetaminophen 5-325 MG tablet ?Commonly known as: NORCO/VICODIN ?Take 1 tablet by mouth every 8 (eight) hours as needed for moderate pain. ?  ?meloxicam 15 MG tablet ?Commonly known as: MOBIC ?Take 0.5-1 tablets (7.5-15 mg total) by mouth daily as needed for pain. ?  ?multivitamin tablet ?Take 1 tablet by mouth daily. ?  ?Olopatadine HCl 0.2 % Soln ?Commonly known as: Pataday ?Apply 1-2 drops to eye in the morning and at bedtime. ?  ?prazosin 1 MG capsule ?Commonly known as: MINIPRESS ?TAKE 1 CAPSULE BY MOUTH AT BEDTIME. IF NO IMPROVEMENT AFTER 1 WEEK CAN INCREASE TO 2 CAPSULES AT BEDTIME. ?  ? ?  ? ? ?History (reviewed): ?Past Medical History:  ?Diagnosis Date  ? Cataracts, both eyes   ? Colon polyps   ? High cholesterol   ? History of colon polyps 03/25/2020  ? History of prostate cancer 03/25/2020  ? History of prostatectomy 03/25/2020  ? 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 Prostate cancer ([place])   ? ?Past Surgical History:  ?Procedure Laterality Date  ? PROSTATECTOMY    ? ?Family History  ?Problem Relation Age of Onset  ? Cancer Father   ? Prostate cancer Father   ? Colon cancer Father   ? ?Social History  ? ?Socioeconomic History  ? Marital status: Married  ?  Spouse name: Santiago Glad Des'Scisciolo  ? Number of children: 3  ? Years of  education: 15  ? Highest education level: Associate degree: academic program  ?Occupational History  ? Occupation: Retired  ?Tobacco Use  ? Smoking status: Never  ? Smokeless tobacco: Never  ?Substance and Sexual Activity  ? Alcohol use: Yes  ?  Comment: Rare  ? Drug use: Not Currently  ? Sexual activity: Yes  ?  Partners: Female  ?Other Topics Concern  ? Not on file  ?Social History Narrative  ? Lives with his wife. They have three children. He likes to do yardwork in his free time.   ? ?Social Determinants of Health  ? ?Financial Resource Strain: Low Risk   ? Difficulty of Paying Living Expenses: Not hard at  all  ?Food Insecurity: No Food Insecurity  ? Worried About Charity fundraiser in the Last Year: Never true  ? Ran Out of Food in the Last Year: Never true  ?Transportation Needs: No Transportation Needs  ? Lack of Transportation (Medical): No  ? Lack of Transportation (Non-Medical): No  ?Physical Activity: Inactive  ? Days of Exercise per Week: 0 days  ? Minutes of Exercise per Session: 0 min  ?Stress: No Stress Concern Present  ? Feeling of Stress : Not at all  ?Social Connections: Moderately Integrated  ? Frequency of Communication with Friends and Family: More than three times a week  ? Frequency of Social Gatherings with Friends and Family: Three times a week  ? Attends Religious Services: More than 4 times per year  ? Active Member of Clubs or Organizations: No  ? Attends Archivist Meetings: Never  ? Marital Status: Married  ? ? ?Activities of Daily Living ? ?  04/10/2022  ? 10:42 AM  ?In your present state of health, do you have any difficulty performing the following activities:  ?Hearing? 1  ?Comment ringing in the ears but it goes away  ?Vision? 0  ?Difficulty concentrating or making decisions? 0  ?Walking or climbing stairs? 0  ?Dressing or bathing? 0  ?Doing errands, shopping? 0  ?Preparing Food and eating ? N  ?Using the Toilet? N  ?In the past six months, have you accidently leaked urine? Y  ?Comment since his prostate surgery he leakes urine. He has to wear a pad at night.  ?Do you have problems with loss of bowel control? N  ?Managing your Medications? N  ?Managing your Finances? N  ?Housekeeping or managing your Housekeeping? N  ? ? ?Patient Education/Literacy ?How often do you need to have someone help you when you read instructions, pamphlets, or other written materials from your doctor or pharmacy?: 1 - Never ?What is the last grade level you completed in school?: Associates degree ? ?Exercise ?Current Exercise Habits: The patient does not participate in regular exercise at present,  Exercise limited by: None identified ? ?Diet ?Patient reports consuming 3 meals a day and 1 snack(s) a day ?Patient reports that his primary diet is: Regular ?Patient reports that she does have regular access to food.  ? ?Depression Screen ? ?  04/10/2022  ? 10:40 AM 04/08/2021  ? 10:13 AM 03/25/2020  ?  2:18 PM  ?PHQ 2/9 Scores  ?PHQ - 2 Score 0 0 0  ?PHQ- 9 Score   6  ?  ? ?Fall Risk ? ?  04/10/2022  ? 10:39 AM 04/08/2021  ? 10:01 AM 03/25/2020  ?  2:17 PM  ?Fall Risk   ?Falls in the past year? 0 0 0  ?Number falls in past yr: 0  0 0  ?Injury with Fall? 0 0 0  ?Risk for fall due to : No Fall Risks No Fall Risks   ?Follow up Falls evaluation completed Falls evaluation completed   ? ?  ?Objective:  ? ?BP (!) 148/81 (BP Location: Right Arm, Patient Position: Sitting, Cuff Size: Normal)   Pulse (!) 57   Ht '5\' 5"'$  (1.651 m)   Wt 199 lb 1.9 oz (90.3 kg)   SpO2 99%   BMI 33.14 kg/m?   ?Last Weight  Most recent update: 04/10/2022 10:33 AM  ? ? Weight  ?90.3 kg (199 lb 1.9 oz)  ?      ? ?  ?  ?Body mass index is 33.14 kg/m?. ? ?Hearing/Vision  ?Baraa did not have difficulty with hearing/understanding during the face-to-face interview ?Shawnte did not have difficulty with his vision during the face-to-face interview ?Reports that he has had a formal eye exam by an eye care professional within the past year ?Reports that he has not had a formal hearing evaluation within the past year ? ?Cognitive Function: ? ?  04/10/2022  ? 10:31 AM 04/08/2021  ? 10:21 AM  ?6CIT Screen  ?What Year? 0 points 0 points  ?What month? 0 points 0 points  ?What time? 0 points 0 points  ?Count back from 20 0 points 0 points  ?Months in reverse 0 points 0 points  ?Repeat phrase 0 points 0 points  ?Total Score 0 points 0 points  ? ? ?Normal Cognitive Function Screening: Yes ?(Normal:0-7, Significant for Dysfunction: >8) ? ?Immunization & Health Maintenance Record ?Immunization History  ?Administered Date(s) Administered  ? Fluad Quad(high Dose 65+) 09/23/2020,  10/04/2021  ? PFIZER(Purple Top)SARS-COV-2 Vaccination 02/09/2020, 03/01/2020, 09/24/2020  ? PNEUMOCOCCAL CONJUGATE-20 04/08/2021  ? Pension scheme manager 55yr & up 10/04/2021  ? ? ?Health Maintenance  ?Top

## 2022-04-10 NOTE — Patient Instructions (Addendum)
?MEDICARE ANNUAL WELLNESS VISIT ?Health Maintenance Summary and Written Plan of Care ? ?[name] , ? ?Thank you for allowing me to perform your Medicare Annual Wellness Visit and for your ongoing commitment to your health.  ? ?Health Maintenance & Immunization History ?Health Maintenance  ?Topic Date Due  ? Zoster Vaccines- Shingrix (1 of 2) 07/10/2022 (Originally 09/15/1991)  ? TETANUS/TDAP  04/11/2023 (Originally [birth year]1960)  ? INFLUENZA VACCINE  07/18/2022  ? COLONOSCOPY (Pts 45-34yr Insurance coverage will need to be confirmed)  05/18/2024  ? Pneumonia Vaccine 81 Years old  Completed  ? COVID-19 Vaccine  Completed  ? HPV VACCINES  Aged Out  ? ?Immunization History  ?Administered Date(s) Administered  ? Fluad Quad(high Dose 65+) 09/23/2020, 10/04/2021  ? PFIZER(Purple Top)SARS-COV-2 Vaccination 02/09/2020, 03/01/2020, 09/24/2020  ? PNEUMOCOCCAL CONJUGATE-20 04/08/2021  ? PPension scheme manager170yr& up 10/04/2021  ? ? ?These are the patient goals that we discussed: ? Goals Addressed   ? ?  ?  ?  ?  ?  ? This Visit's Progress  ?   Patient Stated (pt-stated)     ?   Would like to loose 10 lbs ?  ? ?  ?  ? ?This is a list of Health Maintenance Items that are overdue or due now: ?Td vaccine ?Shingrix ?  ? ?Orders/Referrals Placed Today: ?No orders of the defined types were placed in this encounter. ? ?(Contact our referral department at [phone] you have not spoken with someone about your referral appointment within the next 5 days)  ? ? ?Follow-up Plan ?Follow-up with No primary care provider on file. as planned ?Please schedule your transfer of care appointment to establish with new PCP.  ?Please bring your immunization record with you at your next appointment. ?Medicare wellness visit in one year.  ?AVS printed and given to the patient. ? ? ?  ?Health Maintenance, Male ?Adopting a healthy lifestyle and getting preventive care are important in promoting health and wellness. Ask  your health care provider about: ?The right schedule for you to have regular tests and exams. ?Things you can do on your own to prevent diseases and keep yourself healthy. ?What should I know about diet, weight, and exercise? ?Eat a healthy diet ? ?Eat a diet that includes plenty of vegetables, fruits, low-fat dairy products, and lean protein. ?Do not eat a lot of foods that are high in solid fats, added sugars, or sodium. ?Maintain a healthy weight ?Body mass index (BMI) is a measurement that can be used to identify possible weight problems. It estimates body fat based on height and weight. Your health care provider can help determine your BMI and help you achieve or maintain a healthy weight. ?Get regular exercise ?Get regular exercise. This is one of the most important things you can do for your health. Most adults should: ?Exercise for at least 150 minutes each week. The exercise should increase your heart rate and make you sweat (moderate-intensity exercise). ?Do strengthening exercises at least twice a week. This is in addition to the moderate-intensity exercise. ?Spend less time sitting. Even light physical activity can be beneficial. ?Watch cholesterol and blood lipids ?Have your blood tested for lipids and cholesterol at 81ears of age, then have this test every 5 years. ?You may need to have your cholesterol levels checked more often if: ?Your lipid or cholesterol levels are high. ?You are older than 81ears of age. ?You are at high risk for heart disease. ?What should I  know about cancer screening? ?Many types of cancers can be detected early and may often be prevented. Depending on your health history and family history, you may need to have cancer screening at various ages. This may include screening for: ?Colorectal cancer. ?Prostate cancer. ?Skin cancer. ?Lung cancer. ?What should I know about heart disease, diabetes, and high blood pressure? ?Blood pressure and heart disease ?High blood pressure  causes heart disease and increases the risk of stroke. This is more likely to develop in people who have high blood pressure readings or are overweight. ?Talk with your health care provider about your target blood pressure readings. ?Have your blood pressure checked: ?Every 3-5 years if you are 81-81 years of age. ?Every year if you are 81 years old or older. ?If you are between the ages of 71 and 11 and are a current or former smoker, ask your health care provider if you should have a one-time screening for abdominal aortic aneurysm (AAA). ?Diabetes ?Have regular diabetes screenings. This checks your fasting blood sugar level. Have the screening done: ?Once every three years after age 42 if you are at a normal weight and have a low risk for diabetes. ?More often and at a younger age if you are overweight or have a high risk for diabetes. ?What should I know about preventing infection? ?Hepatitis B ?If you have a higher risk for hepatitis B, you should be screened for this virus. Talk with your health care provider to find out if you are at risk for hepatitis B infection. ?Hepatitis C ?Blood testing is recommended for: ?Everyone born from [birth year] through 1965. ?Anyone with known risk factors for hepatitis C. ?Sexually transmitted infections (STIs) ?You should be screened each year for STIs, including gonorrhea and chlamydia, if: ?You are sexually active and are younger than 81 years of age. ?You are older than 81 years of age and your health care provider tells you that you are at risk for this type of infection. ?Your sexual activity has changed since you were last screened, and you are at increased risk for chlamydia or gonorrhea. Ask your health care provider if you are at risk. ?Ask your health care provider about whether you are at high risk for HIV. Your health care provider may recommend a prescription medicine to help prevent HIV infection. If you choose to take medicine to prevent HIV, you should first get  tested for HIV. You should then be tested every 3 months for as long as you are taking the medicine. ?Follow these instructions at home: ?Alcohol use ?Do not drink alcohol if your health care provider tells you not to drink. ?If you drink alcohol: ?Limit how much you have to 0-2 drinks a day. ?Know how much alcohol is in your drink. In the [place] one drink equals one 12 oz bottle of beer (355 mL), one 5 oz glass of wine (148 mL), or one 1? oz glass of hard liquor (44 mL). ?Lifestyle ?Do not use any products that contain nicotine or tobacco. These products include cigarettes, chewing tobacco, and vaping devices, such as e-cigarettes. If you need help quitting, ask your health care provider. ?Do not use street drugs. ?Do not share needles. ?Ask your health care provider for help if you need support or information about quitting drugs. ?General instructions ?Schedule regular health, dental, and eye exams. ?Stay current with your vaccines. ?Tell your health care provider if: ?You often feel depressed. ?You have ever been abused or do not feel safe at home. ?  Summary ?Adopting a healthy lifestyle and getting preventive care are important in promoting health and wellness. ?Follow your health care provider's instructions about healthy diet, exercising, and getting tested or screened for diseases. ?Follow your health care provider's instructions on monitoring your cholesterol and blood pressure. ?This information is not intended to replace advice given to you by your health care provider. Make sure you discuss any questions you have with your health care provider. ?Document Revised: 04/25/2021 Document Reviewed: 04/25/2021 ?Elsevier Patient Education ? [place]. ? ?

## 2022-04-19 ENCOUNTER — Other Ambulatory Visit: Payer: Self-pay | Admitting: Sports Medicine

## 2022-04-19 DIAGNOSIS — M4722 Other spondylosis with radiculopathy, cervical region: Secondary | ICD-10-CM

## 2022-04-19 MED ORDER — GABAPENTIN 300 MG PO CAPS: 300.0000 mg | ORAL_CAPSULE | Freq: Every day | ORAL | 3 refills | Status: DC

## 2022-04-24 ENCOUNTER — Ambulatory Visit (INDEPENDENT_AMBULATORY_CARE_PROVIDER_SITE_OTHER): Payer: Medicare Other

## 2022-04-24 ENCOUNTER — Ambulatory Visit (INDEPENDENT_AMBULATORY_CARE_PROVIDER_SITE_OTHER): Payer: Medicare Other | Admitting: Sports Medicine

## 2022-04-24 DIAGNOSIS — M5416 Radiculopathy, lumbar region: Secondary | ICD-10-CM | POA: Diagnosis not present

## 2022-04-24 DIAGNOSIS — M21371 Foot drop, right foot: Secondary | ICD-10-CM

## 2022-04-24 DIAGNOSIS — M47816 Spondylosis without myelopathy or radiculopathy, lumbar region: Secondary | ICD-10-CM

## 2022-04-24 MED ORDER — PREDNISONE 50 MG PO TABS: ORAL_TABLET | ORAL | 0 refills | Status: DC

## 2022-04-24 NOTE — Progress Notes (Signed)
? ? ?  Procedures performed today:   ? ?None. ? ?Independent interpretation of notes and tests performed by another provider:  ? ?None. ? ?Brief History, Exam, Impression, and Recommendations:   ? ?Lumbar spondylosis ?Brian Donovan is a very pleasant 81 year old male, he is having an increase in low back pain, right-sided, better with flexion with radicular symptoms down the anterior aspect of his right shin to the foot. ?He does have significant weakness to dorsiflexion of his right ankle compared to the left consistent with a right foot drop. ?I do think he has lumbar spinal stenosis considering improvement with flexion with a right-sided L4 radiculopathy. ?Due to his progressive weakness we will proceed with steroids, x-rays, lumbar spine MRI with likely a right L4-L5 transforaminal epidural based on the results. ? ?Chronic process with exacerbation and pharmacologic intervention ? ?___________________________________________ ?Gwen Her. Dianah Field, M.D., ABFM., CAQSM. ?Primary Care and Sports Medicine ?[place] ? ?Adjunct Instructor of Family Medicine  ?[employer] ?

## 2022-04-24 NOTE — Assessment & Plan Note (Signed)
Brian Donovan is a very pleasant 81 year old male, he is having an increase in low back pain, right-sided, better with flexion with radicular symptoms down the anterior aspect of his right shin to the foot. ?He does have significant weakness to dorsiflexion of his right ankle compared to the left consistent with a right foot drop. ?I do think he has lumbar spinal stenosis considering improvement with flexion with a right-sided L4 radiculopathy. ?Due to his progressive weakness we will proceed with steroids, x-rays, lumbar spine MRI with likely a right L4-L5 transforaminal epidural based on the results. ? ?

## 2022-04-25 ENCOUNTER — Other Ambulatory Visit: Payer: Self-pay | Admitting: Sports Medicine

## 2022-04-25 DIAGNOSIS — M47816 Spondylosis without myelopathy or radiculopathy, lumbar region: Secondary | ICD-10-CM

## 2022-05-03 ENCOUNTER — Ambulatory Visit
Admission: RE | Admit: 2022-05-03 | Discharge: 2022-05-03 | Disposition: A | Discharge status: Home / Self Care | Payer: Medicare Other | Source: Ambulatory Visit | Attending: Sports Medicine | Admitting: Sports Medicine

## 2022-05-03 DIAGNOSIS — M47816 Spondylosis without myelopathy or radiculopathy, lumbar region: Secondary | ICD-10-CM

## 2022-05-03 MED ORDER — IOPAMIDOL (ISOVUE-M 200) INJECTION 41%
1.0000 mL | Freq: Once | INTRAMUSCULAR | Status: AC
  Administered 2022-05-03: 1 mL via EPIDURAL

## 2022-05-03 MED ORDER — METHYLPREDNISOLONE ACETATE 40 MG/ML INJ SUSP (RADIOLOG
80.0000 mg | Freq: Once | INTRAMUSCULAR | Status: AC
  Administered 2022-05-03: 80 mg via EPIDURAL

## 2022-05-03 NOTE — Discharge Instructions (Signed)

## 2022-05-10 ENCOUNTER — Other Ambulatory Visit: Payer: Self-pay | Admitting: Osteopathic Medicine

## 2022-05-22 ENCOUNTER — Ambulatory Visit (INDEPENDENT_AMBULATORY_CARE_PROVIDER_SITE_OTHER): Payer: Medicare Other | Admitting: Sports Medicine

## 2022-05-22 DIAGNOSIS — M47816 Spondylosis without myelopathy or radiculopathy, lumbar region: Secondary | ICD-10-CM

## 2022-05-22 NOTE — Progress Notes (Signed)
    Procedures performed today:    None.  Independent interpretation of notes and tests performed by another provider:   None.  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 He did have some right-sided foot drop compared to the left. We treated him with a right L4-L5 transforaminal epidural, he had some relief. We will go ahead and do his second epidural, he is interested in giving a trial for all 3. If insufficient improvement we will have neurosurgery weigh in for discussion of laminectomy. Return to see me 1 month after second epidural. The transforaminal was fairly painful so we will try interlaminar at this time.    ___________________________________________ Gwen Her. Dianah Field, M.D., ABFM., CAQSM. Primary Care and [place] Instructor of [place] [employer] [facility] [employer]

## 2022-05-22 NOTE — Assessment & Plan Note (Signed)
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 He did have some right-sided foot drop compared to the left. We treated him with a right L4-L5 transforaminal epidural, he had some relief. We will go ahead and do his second epidural, he is interested in giving a trial for all 3. If insufficient improvement we will have neurosurgery weigh in for discussion of laminectomy. Return to see me 1 month after second epidural. The transforaminal was fairly painful so we will try interlaminar at this time.

## 2022-05-29 ENCOUNTER — Encounter: Payer: Self-pay | Admitting: Family Medicine

## 2022-05-29 ENCOUNTER — Ambulatory Visit (INDEPENDENT_AMBULATORY_CARE_PROVIDER_SITE_OTHER): Payer: Medicare Other | Admitting: Family Medicine

## 2022-05-29 VITALS — BP 112/66 | HR 67 | Ht 65.0 in | Wt 193.0 lb

## 2022-05-29 DIAGNOSIS — G2581 Restless legs syndrome: Secondary | ICD-10-CM | POA: Diagnosis not present

## 2022-05-29 DIAGNOSIS — E782 Mixed hyperlipidemia: Secondary | ICD-10-CM

## 2022-05-29 DIAGNOSIS — M47816 Spondylosis without myelopathy or radiculopathy, lumbar region: Secondary | ICD-10-CM

## 2022-05-29 DIAGNOSIS — N39498 Other specified urinary incontinence: Secondary | ICD-10-CM | POA: Diagnosis not present

## 2022-05-29 DIAGNOSIS — R32 Unspecified urinary incontinence: Secondary | ICD-10-CM | POA: Insufficient documentation

## 2022-05-29 NOTE — Assessment & Plan Note (Signed)
Status post prostatectomy.  He already has dry mouth and dry eyes so anticholinergic medications are probably not a good option for him.  We did discuss Myrbetriq which may provide some relief.  He will think about this.

## 2022-05-29 NOTE — Progress Notes (Signed)
Brian Donovan - 81 y.o. male MRN [id number]  Date of birth: September 03, 1941  Subjective Chief Complaint  Patient presents with   Transitions Of Care    HPI Brian Donovan is an 81 year old male here today for follow-up visit.  He has history of prostate cancer status post prostatectomy.  He has had some urinary incontinence since having this completed.  He has never tried any medication to help with his incontinence symptoms.  He does use a depends pad.    He does have hyperlipidemia that is treated with atorvastatin.  He reports he is doing well with this and denies side effects.  He is due to have updated lipid panel.  He has also been seeing Dr. Dianah Field for back pain.  He had epidural spine injection previously and is scheduled for another one tomorrow.  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   Prostate cancer ([place])     Past Surgical History:  Procedure Laterality Date   PROSTATECTOMY      Social History   Socioeconomic History   Marital status: Married    Spouse name: Santiago Glad Des'Scisciolo   Number of children: 3   Years of education: 15   Highest education level: Associate degree: academic program  Occupational History   Occupation: Retired  Tobacco Use   Smoking status: Never   Smokeless tobacco: Never  Substance and Sexual Activity   Alcohol use: Yes    Comment: Rare   Drug use: Not Currently   Sexual activity: Yes    Partners: Female  Other Topics Concern   Not on file  Social History Narrative   Lives with his wife. They have three children. He likes to do yardwork in his free time.    Social  Determinants of Health   Financial Resource Strain: Low Risk  (04/10/2022)   Overall Financial Resource Strain (CARDIA)    Difficulty of Paying Living Expenses: Not hard at all  Food Insecurity: No Food Insecurity (04/10/2022)   Hunger Vital Sign    Worried About Running Out of Food in the Last Year: Never true    Ran Out of Food in the Last Year: Never true  Transportation Needs: No Transportation Needs (04/10/2022)   PRAPARE - Hydrologist (Medical): No    Lack of Transportation (Non-Medical): No  Physical Activity: Inactive (04/10/2022)   Exercise Vital Sign    Days of Exercise per Week: 0 days    Minutes of Exercise per Session: 0 min  Stress: No Stress Concern Present (04/10/2022)   [place]    Feeling of Stress : Not at all  Social Connections: Moderately Integrated (04/10/2022)   Social Connection and Isolation Panel [NHANES]    Frequency of Communication with Friends and Family: More than three times a week    Frequency of Social Gatherings with Friends and Family: Three times a week    Attends Religious Services: More than 4 times per year    Active Member of Clubs or Organizations: No    Attends Archivist Meetings: Never  Marital Status: Married    Family History  Problem Relation Age of Onset   Cancer Father    Prostate cancer Father    Colon cancer Father     Health Maintenance  Topic Date Due   Zoster Vaccines- Shingrix (1 of 2) 07/10/2022 (Originally 09/15/1991)   COVID-19 Vaccine (5 - Pfizer series) 08/29/2022 (Originally 02/04/2022)   TETANUS/TDAP  04/11/2023 (Originally 09/19/2020)   INFLUENZA VACCINE  07/18/2022   COLONOSCOPY (Pts 45-11yr Insurance coverage will need to be confirmed)  05/18/2024   Pneumonia Vaccine 81 Years old  Completed   HPV VACCINES  Aged Out      ----------------------------------------------------------------------------------------------------------------------------------------------------------------------------------------------------------------- Physical Exam BP 112/66 (BP Location: Right Arm, Patient Position: Sitting, Cuff Size: Normal)   Pulse 67   Ht '5\' 5"'$  (1.651 m)   Wt 193 lb (87.5 kg)   SpO2 97%   BMI 32.12 kg/m   Physical Exam Constitutional:      Appearance: Normal appearance.  Eyes:     General: No scleral icterus. Neurological:     Mental Status: He is alert.  Psychiatric:        Mood and Affect: Mood normal.        Behavior: Behavior normal.     ------------------------------------------------------------------------------------------------------------------------------------------------------------------------------------------------------------------- Assessment and Plan  Urinary incontinence Status post prostatectomy.  He already has dry mouth and dry eyes so anticholinergic medications are probably not a good option for him.  We did discuss Myrbetriq which may provide some relief.  He will think about this.  Mixed hyperlipidemia Tolerating atorvastatin well.  We will plan to continue at current strength.  Updating labs today.  Lumbar spondylosis Currently managed by Dr. TDianah Field  He has upcoming injection tomorrow.   No orders of the defined types were placed in this encounter.   Return in about 6 months (around 11/28/2022) for HLD.    This visit occurred during the SARS-CoV-2 public health emergency.  Safety protocols were in place, including screening questions prior to the visit, additional usage of staff PPE, and extensive cleaning of exam room while observing appropriate contact time as indicated for disinfecting solutions.

## 2022-05-29 NOTE — Assessment & Plan Note (Signed)
Tolerating atorvastatin well.  We will plan to continue at current strength.  Updating labs today.

## 2022-05-29 NOTE — Patient Instructions (Signed)
We can try Myrbetriq to help with bladder if you would like, jut let me know if you want to try this.

## 2022-05-29 NOTE — Assessment & Plan Note (Signed)
Currently managed by Dr. Dianah Field.  He has upcoming injection tomorrow.

## 2022-05-30 ENCOUNTER — Ambulatory Visit
Admission: RE | Admit: 2022-05-30 | Discharge: 2022-05-30 | Disposition: A | Discharge status: Home / Self Care | Payer: Medicare Other | Source: Ambulatory Visit | Attending: Sports Medicine | Admitting: Sports Medicine

## 2022-05-30 DIAGNOSIS — M47816 Spondylosis without myelopathy or radiculopathy, lumbar region: Secondary | ICD-10-CM

## 2022-05-30 MED ORDER — METHYLPREDNISOLONE ACETATE 40 MG/ML INJ SUSP (RADIOLOG
80.0000 mg | Freq: Once | INTRAMUSCULAR | Status: AC
  Administered 2022-05-30: 80 mg via EPIDURAL

## 2022-05-30 MED ORDER — IOPAMIDOL (ISOVUE-M 200) INJECTION 41%
1.0000 mL | Freq: Once | INTRAMUSCULAR | Status: AC
  Administered 2022-05-30: 1 mL via EPIDURAL

## 2022-05-30 NOTE — Discharge Instructions (Signed)

## 2022-06-01 LAB — CBC WITH DIFFERENTIAL/PLATELET
Absolute Monocytes: 663 cells/uL (ref 200–950)
Basophils Absolute: 31 cells/uL (ref 0–200)
Basophils Relative: 0.4 %
Eosinophils Absolute: 31 cells/uL (ref 15–500)
Eosinophils Relative: 0.4 %
HCT: 42.5 % (ref 38.5–50.0)
Hemoglobin: 14.4 g/dL (ref 13.2–17.1)
Lymphs Abs: 1287 cells/uL (ref 850–3900)
MCH: 31.7 pg (ref 27.0–33.0)
MCHC: 33.9 g/dL (ref 32.0–36.0)
MCV: 93.6 fL (ref 80.0–100.0)
MPV: 10.8 fL (ref 7.5–12.5)
Monocytes Relative: 8.5 %
Neutro Abs: 5788 cells/uL (ref 1500–7800)
Neutrophils Relative %: 74.2 %
Platelets: 216 10*3/uL (ref 140–400)
RBC: 4.54 10*6/uL (ref 4.20–5.80)
RDW: 13.1 % (ref 11.0–15.0)
Total Lymphocyte: 16.5 %
WBC: 7.8 10*3/uL (ref 3.8–10.8)

## 2022-06-01 LAB — LIPID PANEL W/REFLEX DIRECT LDL
Cholesterol: 124 mg/dL (ref <200)
HDL: 49 mg/dL (ref >=40)
LDL Cholesterol (Calc): 60 mg/dL (calc)
Non-HDL Cholesterol (Calc): 75 mg/dL (calc) (ref <130)
Total CHOL/HDL Ratio: 2.5 (calc) (ref <5.0)
Triglycerides: 74 mg/dL (ref <150)

## 2022-06-01 LAB — COMPLETE METABOLIC PANEL WITH GFR
AG Ratio: 1.9 (calc) (ref 1.0–2.5)
ALT: 19 U/L (ref 9–46)
AST: 17 U/L (ref 10–35)
Albumin: 4.3 g/dL (ref 3.6–5.1)
Alkaline phosphatase (APISO): 65 U/L (ref 35–144)
BUN: 21 mg/dL (ref 7–25)
CO2: 29 mmol/L (ref 20–32)
Calcium: 9.7 mg/dL (ref 8.6–10.3)
Chloride: 107 mmol/L (ref 98–110)
Creat: 1.03 mg/dL (ref 0.70–1.22)
Globulin: 2.3 g/dL (calc) (ref 1.9–3.7)
Glucose, Bld: 94 mg/dL (ref 65–99)
Potassium: 5.2 mmol/L (ref 3.5–5.3)
Sodium: 142 mmol/L (ref 135–146)
Total Bilirubin: 0.8 mg/dL (ref 0.2–1.2)
Total Protein: 6.6 g/dL (ref 6.1–8.1)
eGFR: 73 mL/min/{1.73_m2} (ref >=60)

## 2022-06-01 LAB — IRON,TIBC AND FERRITIN PANEL
%SAT: 33 % (calc) (ref 20–48)
Ferritin: 325 ng/mL (ref 24–380)
Iron: 98 ug/dL (ref 50–180)
TIBC: 294 mcg/dL (calc) (ref 250–425)

## 2022-06-01 LAB — TSH: TSH: 1.77 mIU/L (ref 0.40–4.50)

## 2022-07-06 ENCOUNTER — Other Ambulatory Visit: Payer: Self-pay | Admitting: Osteopathic Medicine

## 2022-08-09 ENCOUNTER — Other Ambulatory Visit: Payer: Self-pay | Admitting: Sports Medicine

## 2022-08-18 ENCOUNTER — Other Ambulatory Visit: Payer: Self-pay | Admitting: Sports Medicine

## 2022-08-18 DIAGNOSIS — M4722 Other spondylosis with radiculopathy, cervical region: Secondary | ICD-10-CM

## 2022-09-13 ENCOUNTER — Telehealth: Payer: Self-pay

## 2022-09-13 DIAGNOSIS — M47816 Spondylosis without myelopathy or radiculopathy, lumbar region: Secondary | ICD-10-CM

## 2022-09-13 NOTE — Telephone Encounter (Signed)
Before I order another epidural, the 2 epidurals that he had done were done with different approaches, which one seemed to work the best, the first of the second?    As for vaccines I do flu can be done here, COVID will have to be done at his pharmacy and shingles will have to be done at his pharmacy because he has Medicare.

## 2022-09-13 NOTE — Telephone Encounter (Signed)
Patient called to request another epidural. He also asked about the shingles, COVID, and flu vaccines. Which ones can he get here?

## 2022-09-14 NOTE — Telephone Encounter (Signed)
Spoke with patient, he stated that the first injection seemed to give him more relief. Call transferred tot he front desk to schedule a nurse visit for a flu shot.

## 2022-09-15 NOTE — Telephone Encounter (Signed)
Right L4-L5 transforaminal epidural ordered

## 2022-09-26 ENCOUNTER — Ambulatory Visit
Admission: RE | Admit: 2022-09-26 | Discharge: 2022-09-26 | Disposition: A | Discharge status: Home / Self Care | Payer: Medicare Other | Source: Ambulatory Visit | Attending: Sports Medicine | Admitting: Sports Medicine

## 2022-09-26 DIAGNOSIS — M47816 Spondylosis without myelopathy or radiculopathy, lumbar region: Secondary | ICD-10-CM

## 2022-09-26 MED ORDER — METHYLPREDNISOLONE ACETATE 40 MG/ML INJ SUSP (RADIOLOG
80.0000 mg | Freq: Once | INTRAMUSCULAR | Status: AC
  Administered 2022-09-26: 80 mg via EPIDURAL

## 2022-09-26 MED ORDER — IOPAMIDOL (ISOVUE-M 200) INJECTION 41%
1.0000 mL | Freq: Once | INTRAMUSCULAR | Status: AC
  Administered 2022-09-26: 1 mL via EPIDURAL

## 2022-09-26 NOTE — Discharge Instructions (Signed)

## 2022-10-10 IMAGING — XA Imaging study
2 series · 2 of 2 positions shown · non-contrast
Comparison: none

CLINICAL DATA: Lumbosacral spondylosis without myelopathy. 70%
improvement following the transforaminal epidural injection last
month. Residual low back and right leg pain. Interlaminar epidural
injection requested today. L5 is considered sacralized for
consistency with the 04/24/2022 lumbar spine MRI report.

[Series 1: ortho adipose · 1 of 1 slices shown (1 of 2)]
[im 1/1]
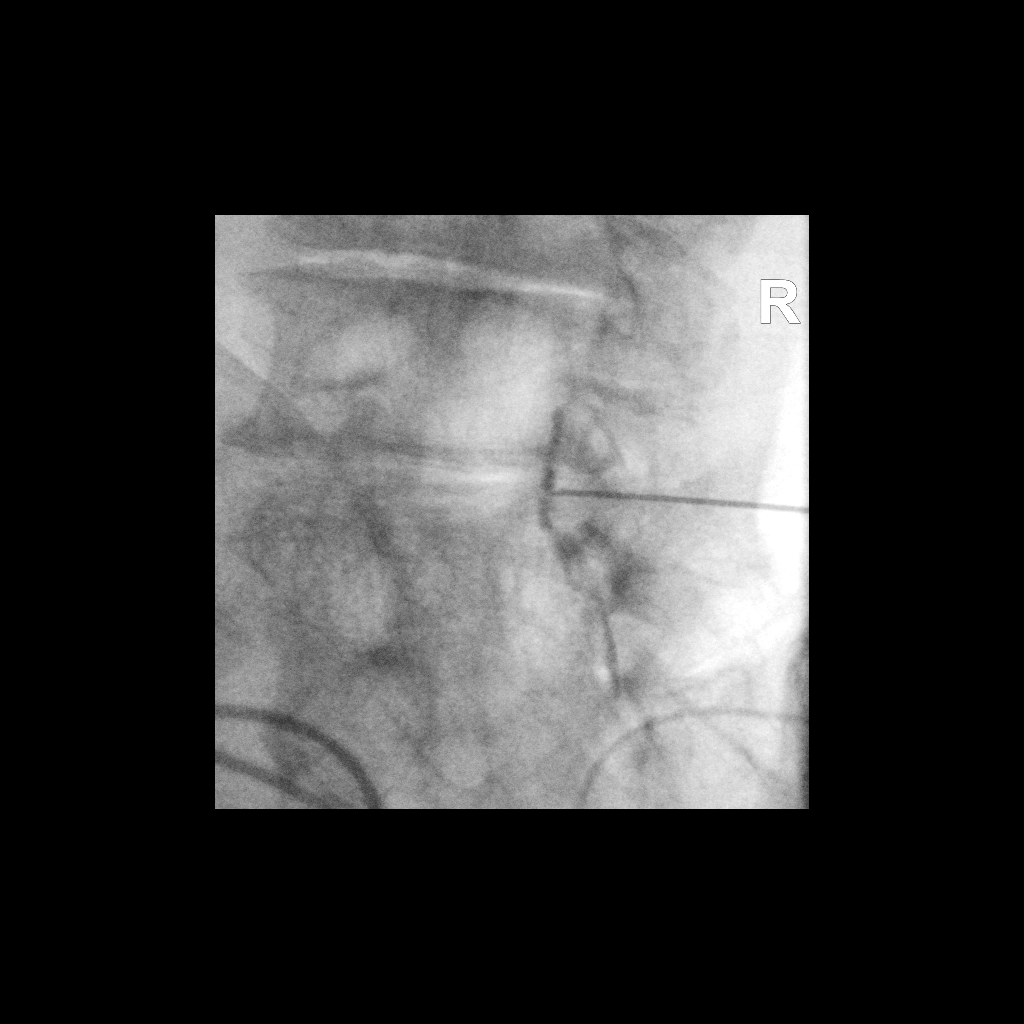

[Series 2: ortho adipose · 1 of 1 slices shown (2 of 2)]
[im 1/1]
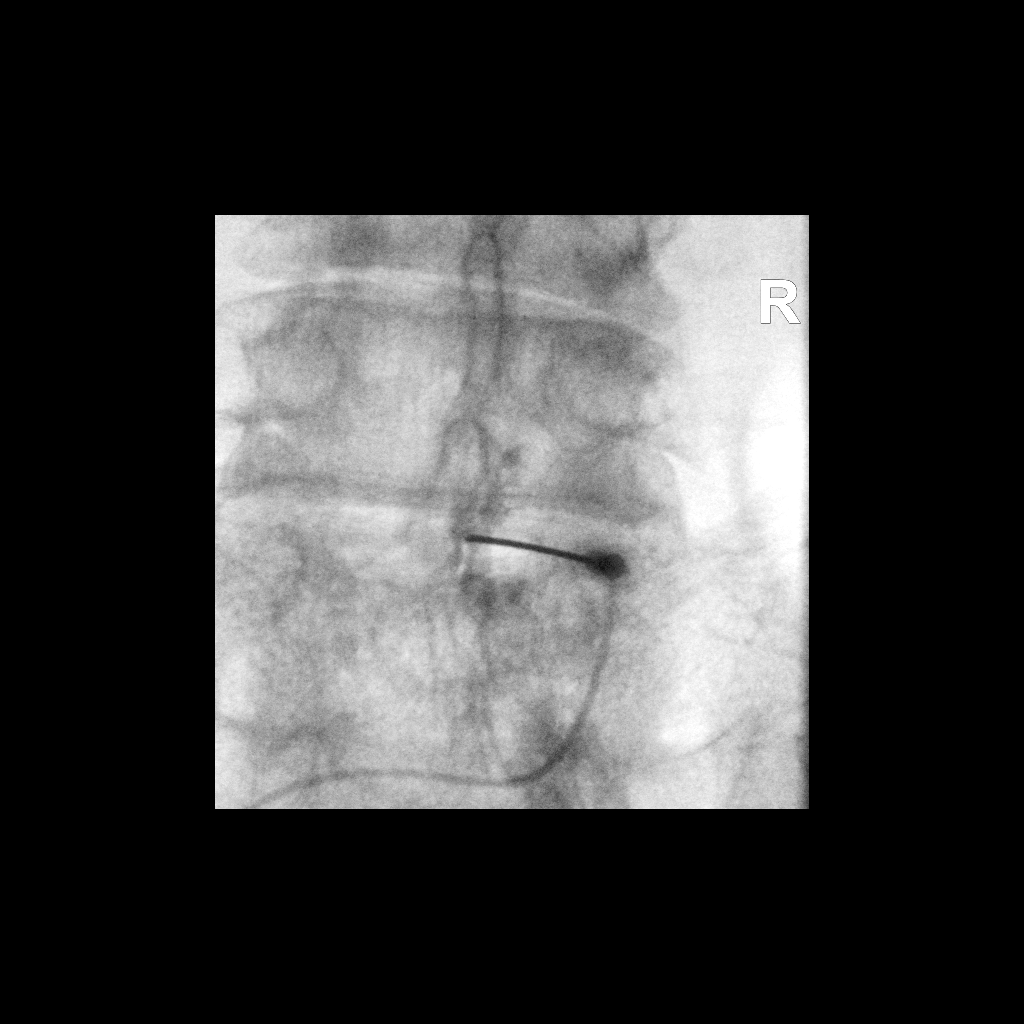

[2 of 2 positions shown; findings below may reference images not displayed]

FLUOROSCOPY:
Radiation Exposure Index (as provided by the fluoroscopic device):
2.0 mGy Kerma

PROCEDURE:
The procedure, risks, benefits, and alternatives were explained to
the patient. Questions regarding the procedure were encouraged and
answered. The patient understands and consents to the procedure.

LUMBAR EPIDURAL INJECTION:

An interlaminar approach was performed on the right at L4-5. The
overlying skin was cleansed and anesthetized. A 3.5 inch 20 gauge
epidural needle was advanced using loss-of-resistance technique.

DIAGNOSTIC EPIDURAL INJECTION:

Injection of Isovue-M 200 shows a good epidural pattern with spread
above and below the level of needle placement, primarily on the
right. No vascular opacification is seen.

THERAPEUTIC EPIDURAL INJECTION:

80 mg of Depo-Medrol mixed with 3 mL of 1% lidocaine were instilled.
The procedure was well-tolerated, and the patient was discharged
thirty minutes following the injection in good condition.

COMPLICATIONS:
None
IMPRESSION: Technically successful interlaminar epidural injection on the right
at L4-5.

## 2022-11-11 ENCOUNTER — Other Ambulatory Visit: Payer: Self-pay | Admitting: Family Medicine

## 2022-11-14 ENCOUNTER — Encounter: Payer: Self-pay | Admitting: Sports Medicine

## 2022-11-14 ENCOUNTER — Ambulatory Visit (INDEPENDENT_AMBULATORY_CARE_PROVIDER_SITE_OTHER): Payer: Medicare Other

## 2022-11-14 ENCOUNTER — Ambulatory Visit (INDEPENDENT_AMBULATORY_CARE_PROVIDER_SITE_OTHER): Payer: Medicare Other | Admitting: Sports Medicine

## 2022-11-14 VITALS — Ht 65.0 in | Wt 203.0 lb

## 2022-11-14 DIAGNOSIS — M4722 Other spondylosis with radiculopathy, cervical region: Secondary | ICD-10-CM

## 2022-11-14 DIAGNOSIS — M79641 Pain in right hand: Secondary | ICD-10-CM | POA: Diagnosis not present

## 2022-11-14 NOTE — Progress Notes (Signed)
    Procedures performed today:    None.  Independent interpretation of notes and tests performed by another provider:   None.  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 He really does not have much discomfort at the thumb basal joint, and no pain with stressing of the thumb basal joint, I think this is probably more of a right-sided C6 cervical radicular process. He will do meloxicam at night, adding x-rays of his neck, hand, home conditioning for his cervical spine and hand and he can return to see me in 6 weeks, we will get more aggressive with imaging of his cervical spine if not better.    ____________________________________________ Gwen Her. Dianah Field, M.D., ABFM., CAQSM., AME. Primary Care and Sports Medicine [facility]  Adjunct Professor of [employer] [facility] [employer] of Medicine  Risk manager

## 2022-11-14 NOTE — Assessment & Plan Note (Signed)
Brian Donovan returns, he is having increasing discomfort right hand, localized in the palm with radiation to the thumb, but somewhat vague and he tells me it is on the verge of numbness and tingling. He really does not have much discomfort at the thumb basal joint, and no pain with stressing of the thumb basal joint, I think this is probably more of a right-sided C6 cervical radicular process. He will do meloxicam at night, adding x-rays of his neck, hand, home conditioning for his cervical spine and hand and he can return to see me in 6 weeks, we will get more aggressive with imaging of his cervical spine if not better.

## 2022-11-28 ENCOUNTER — Ambulatory Visit (INDEPENDENT_AMBULATORY_CARE_PROVIDER_SITE_OTHER): Payer: Medicare Other | Admitting: Family Medicine

## 2022-11-28 ENCOUNTER — Encounter: Payer: Self-pay | Admitting: Family Medicine

## 2022-11-28 VITALS — BP 139/65 | HR 63 | Ht 65.0 in | Wt 198.0 lb

## 2022-11-28 DIAGNOSIS — E782 Mixed hyperlipidemia: Secondary | ICD-10-CM | POA: Diagnosis not present

## 2022-11-28 NOTE — Assessment & Plan Note (Signed)
Doing well with atorvastatin at current strength.  Lab Results  Component Value Date   LDLCALC 60 05/31/2022

## 2022-11-28 NOTE — Progress Notes (Signed)
Brian Donovan - 81 y.o. male MRN [id number]  Date of birth: 1941/04/14  Subjective Chief Complaint  Patient presents with   Follow-up    HPI Brian Donovan is a 81 y.o. male here today for follow up visit.   Reports that he is doing pretty well.  Continues to have neck and back pain.  Seeing Dr. Dianah Field for this.  Continues on meloxicam and gabapentin.    Continues with atorvastatin for HLD. Tolerating well.  Denies myalgias with medication.   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   Prostate cancer ([place])     Past Surgical History:  Procedure Laterality Date   PROSTATECTOMY      Social History   Socioeconomic History   Marital status: Married    Spouse name: Brian Donovan   Number of children: 3   Years of education: 15   Highest education level: Associate degree: academic program  Occupational History   Occupation: Retired  Tobacco Use   Smoking status: Never   Smokeless tobacco: Never  Substance and Sexual Activity   Alcohol use: Yes    Comment: Rare   Drug use: Not Currently   Sexual activity: Yes    Partners: Female  Other Topics Concern   Not on file  Social History Narrative   Lives with his wife. They have three children. He likes to do yardwork in his free time.    Social Determinants of Health   Financial Resource Strain: Low Risk  (04/10/2022)   Overall Financial Resource Strain (CARDIA)    Difficulty of Paying Living Expenses: Not hard at all  Food Insecurity: No Food Insecurity (04/10/2022)   Hunger Vital Sign    Worried About Running Out of Food in the Last  Year: Never true    Ran Out of Food in the Last Year: Never true  Transportation Needs: No Transportation Needs (04/10/2022)   PRAPARE - Hydrologist (Medical): No    Lack of Transportation (Non-Medical): No  Physical Activity: Inactive (04/10/2022)   Exercise Vital Sign    Days of Exercise per Week: 0 days    Minutes of Exercise per Session: 0 min  Stress: No Stress Concern Present (04/10/2022)   [place]    Feeling of Stress : Not at all  Social Connections: Moderately Integrated (04/10/2022)   Social Connection and Isolation Panel [NHANES]    Frequency of Communication with Friends and Family: More than three times a week    Frequency of Social Gatherings with Friends and Family: Three times a week    Attends Religious Services: More than 4 times per year    Active Member of Clubs or Organizations: No    Attends Archivist Meetings: Never    Marital Status: Married    Family History  Problem Relation Age of Onset   Cancer Father    Prostate cancer Father    Colon cancer Father     Health Maintenance  Topic Date Due   DTaP/Tdap/Td (3 - Td or Tdap) 09/19/2020   COVID-19  Vaccine (6 - 2023-24 season) 02/16/2023 (Originally 11/22/2022)   Zoster Vaccines- Shingrix (2 of 2) 02/27/2023 (Originally 11/22/2022)   Medicare Annual Wellness ([place])  04/11/2023   COLONOSCOPY (Pts 45-73yr Insurance coverage will need to be confirmed)  05/18/2024   Pneumonia Vaccine 81 Years old  Completed   INFLUENZA VACCINE  Completed   HPV VACCINES  Aged Out     ----------------------------------------------------------------------------------------------------------------------------------------------------------------------------------------------------------------- Physical Exam BP 139/65 (BP Location: Left Arm, Patient Position: Sitting, Cuff Size: Large)   Pulse 63   Ht '5\' 5"'$  (1.651 m)   Wt  198 lb (89.8 kg)   SpO2 96%   BMI 32.95 kg/m   Physical Exam Constitutional:      Appearance: Normal appearance.  HENT:     Head: Normocephalic and atraumatic.  Eyes:     General: No scleral icterus. Cardiovascular:     Rate and Rhythm: Normal rate and regular rhythm.  Musculoskeletal:     Cervical back: Neck supple.  Neurological:     Mental Status: He is alert.  Psychiatric:        Mood and Affect: Mood normal.        Behavior: Behavior normal.     ------------------------------------------------------------------------------------------------------------------------------------------------------------------------------------------------------------------- Assessment and Plan  Mixed hyperlipidemia Doing well with atorvastatin at current strength.  Lab Results  Component Value Date   LDLCALC 60 05/31/2022     No orders of the defined types were placed in this encounter.   Return in about 6 months (around 05/30/2023) for Annual exam/Fasting labs.    This visit occurred during the SARS-CoV-2 public health emergency.  Safety protocols were in place, including screening questions prior to the visit, additional usage of staff PPE, and extensive cleaning of exam room while observing appropriate contact time as indicated for disinfecting solutions.

## 2022-12-21 ENCOUNTER — Other Ambulatory Visit: Payer: Self-pay | Admitting: Sports Medicine

## 2022-12-21 DIAGNOSIS — M4722 Other spondylosis with radiculopathy, cervical region: Secondary | ICD-10-CM

## 2022-12-26 ENCOUNTER — Ambulatory Visit: Payer: Medicare Other | Admitting: Sports Medicine

## 2023-01-08 ENCOUNTER — Other Ambulatory Visit: Payer: Self-pay | Admitting: Sports Medicine

## 2023-01-08 DIAGNOSIS — M4722 Other spondylosis with radiculopathy, cervical region: Secondary | ICD-10-CM

## 2023-01-10 ENCOUNTER — Telehealth: Payer: Self-pay

## 2023-01-10 NOTE — Telephone Encounter (Signed)
Pt lvm stating he's going on vacation and would like medication refills for travel. Contacted the pharmacy and authorized early refills of gabapentin and atorvastatin.

## 2023-02-12 ENCOUNTER — Other Ambulatory Visit: Payer: Self-pay | Admitting: Sports Medicine

## 2023-02-12 DIAGNOSIS — M4722 Other spondylosis with radiculopathy, cervical region: Secondary | ICD-10-CM

## 2023-02-15 ENCOUNTER — Ambulatory Visit (INDEPENDENT_AMBULATORY_CARE_PROVIDER_SITE_OTHER): Payer: Medicare Other | Admitting: Family Medicine

## 2023-02-15 ENCOUNTER — Encounter: Payer: Self-pay | Admitting: Family Medicine

## 2023-02-15 VITALS — BP 116/72 | HR 81 | Ht 65.0 in | Wt 200.0 lb

## 2023-02-15 DIAGNOSIS — J0100 Acute maxillary sinusitis, unspecified: Secondary | ICD-10-CM | POA: Diagnosis not present

## 2023-02-15 MED ORDER — DOXYCYCLINE HYCLATE 100 MG PO TABS: 100.0000 mg | ORAL_TABLET | Freq: Two times a day (BID) | ORAL | 0 refills | Status: DC

## 2023-02-15 NOTE — Assessment & Plan Note (Signed)
Recommend supportive care at home with increase fluids, nasal saline rinse and Mucinex.  Additionally, we will add a course of doxycycline.  He will let me know if symptoms or not improving with this.

## 2023-02-15 NOTE — Patient Instructions (Signed)

## 2023-02-15 NOTE — Progress Notes (Signed)
Brian Donovan - 82 y.o. male MRN [id number]  Date of birth: 04/03/1941  Subjective Chief Complaint  Patient presents with   Sore Throat   Cough    HPI Brian Donovan is an 82 year old male here today for with complaint of congestion, sinus pain and pressure, pain in upper teeth and thickened yellow-green mucus.  He has had mild headache.  He denies fever or chills.  He did have COVID about a week and 1/2 to 2 weeks ago.  Treated with Paxlovid.  He has not had any lower respiratory symptoms other than mild cough.  He denies shortness of breath or wheezing.  He has not tried anything so far for treatment of this at home.  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   Prostate cancer ([place])     Past Surgical History:  Procedure Laterality Date   PROSTATECTOMY      Social History   Socioeconomic History   Marital status: Married    Spouse name: Brian Donovan   Number of children: 3   Years of education: 15   Highest education level: Associate degree: academic program  Occupational History   Occupation: Retired  Tobacco Use   Smoking status: Never   Smokeless tobacco: Never  Substance and Sexual Activity   Alcohol use: Yes    Comment: Rare   Drug use: Not Currently   Sexual activity: Yes    Partners: Female  Other Topics Concern   Not on file  Social History Narrative   Lives with his wife. They have three children. He likes to do yardwork in his free time.    Social Determinants of Health   Financial Resource Strain: Low Risk  (04/10/2022)   Overall Financial Resource Strain (CARDIA)     Difficulty of Paying Living Expenses: Not hard at all  Food Insecurity: No Food Insecurity (04/10/2022)   Hunger Vital Sign    Worried About Running Out of Food in the Last Year: Never true    Ran Out of Food in the Last Year: Never true  Transportation Needs: No Transportation Needs (04/10/2022)   PRAPARE - Hydrologist (Medical): No    Lack of Transportation (Non-Medical): No  Physical Activity: Inactive (04/10/2022)   Exercise Vital Sign    Days of Exercise per Week: 0 days    Minutes of Exercise per Session: 0 min  Stress: No Stress Concern Present (04/10/2022)   [place]    Feeling of Stress : Not at all  Social Connections: Moderately Integrated (04/10/2022)   Social Connection and Isolation Panel [NHANES]    Frequency of Communication with Friends and Family: More than three times a week    Frequency of Social Gatherings with Friends and Family: Three times a week    Attends Religious Services: More than 4 times per year    Active Member of Clubs or Organizations: No    Attends Archivist Meetings: Never    Marital Status: Married    Family History  Problem Relation Age of Onset  Cancer Father    Prostate cancer Father    Colon cancer Father     Health Maintenance  Topic Date Due   DTaP/Tdap/Td (3 - Td or Tdap) 09/19/2020   Medicare Annual Wellness ([facility])  04/11/2023   Zoster Vaccines- Shingrix (2 of 2) 02/27/2023 (Originally 11/22/2022)   COLONOSCOPY (Pts 45-37yr Insurance coverage will need to be confirmed)  05/18/2024   Pneumonia Vaccine 82 Years old  Completed   INFLUENZA VACCINE  Completed   COVID-19 Vaccine  Completed   HPV VACCINES  Aged Out      ----------------------------------------------------------------------------------------------------------------------------------------------------------------------------------------------------------------- Physical Exam BP 116/72 (BP Location: Right Arm, Patient Position: Sitting, Cuff Size: Large)   Pulse 81   Ht '5\' 5"'$  (1.651 m)   Wt 200 lb (90.7 kg)   SpO2 99%   BMI 33.28 kg/m   Physical Exam Constitutional:      Appearance: He is well-developed.  HENT:     Head: Normocephalic and atraumatic.     Right Ear: Tympanic membrane normal.     Left Ear: Tympanic membrane normal.     Nose:     Comments: Left maxillary sinus tenderness  Eyes:     General: No scleral icterus. Cardiovascular:     Rate and Rhythm: Normal rate and regular rhythm.  Pulmonary:     Effort: Pulmonary effort is normal.     Breath sounds: Normal breath sounds.  Musculoskeletal:     Cervical back: Neck supple.  Neurological:     Mental Status: He is alert.  Psychiatric:        Mood and Affect: Mood normal.        Behavior: Behavior normal.     ------------------------------------------------------------------------------------------------------------------------------------------------------------------------------------------------------------------- Assessment and Plan  Acute maxillary sinusitis Recommend supportive care at home with increase fluids, nasal saline rinse and Mucinex.  Additionally, we will add a course of doxycycline.  He will let me know if symptoms or not improving with this.   Meds ordered this encounter  Medications   doxycycline (VIBRA-TABS) 100 MG tablet    Sig: Take 1 tablet (100 mg total) by mouth 2 (two) times daily.    Dispense:  20 tablet    Refill:  0    No follow-ups on file.    This visit occurred during the SARS-CoV-2 public health emergency.  Safety protocols were in place, including screening questions prior to the visit, additional usage of staff  PPE, and extensive cleaning of exam room while observing appropriate contact time as indicated for disinfecting solutions.

## 2023-05-16 ENCOUNTER — Ambulatory Visit (INDEPENDENT_AMBULATORY_CARE_PROVIDER_SITE_OTHER): Payer: Medicare Other | Admitting: Family Medicine

## 2023-05-16 VITALS — BP 122/61 | HR 71 | Ht 65.0 in | Wt 201.0 lb

## 2023-05-16 DIAGNOSIS — Z0000 Encounter for general adult medical examination without abnormal findings: Secondary | ICD-10-CM | POA: Diagnosis not present

## 2023-05-16 NOTE — Progress Notes (Signed)
MEDICARE ANNUAL WELLNESS VISIT  05/16/2023  Subjective:  Brian Donovan is a 82 y.o. male patient of Everrett Coombe, DO who had a Medicare Annual Wellness Visit today. Brian Donovan is Retired and lives with their spouse. he has 3 children. he reports that he is socially active and does interact with friends/family regularly. he is moderately physically active and enjoys yard work.  Patient Care Team: Everrett Coombe, DO as PCP - General (Family Medicine)     05/16/2023    2:35 PM 04/10/2022   10:39 AM 04/08/2021    9:59 AM  Advanced Directives  Does Patient Have a Medical Advance Directive? Yes Yes Yes  Type of Advance Directive Living will Living will Healthcare Power of [place];Living will  Does patient want to make changes to medical advance directive? No - Patient declined No - Patient declined No - Patient declined  Copy of Healthcare Power of Attorney in Chart?   No - copy requested    Hospital Utilization Over the Past 12 Months: # of hospitalizations or ER visits: 0 # of surgeries: 2 - cataract surgeries  Review of Systems    Patient reports that his overall health is unchanged when compared to last year.  Review of Systems: History obtained from chart review and the patient  All other systems negative.  Pain Assessment Pain : No/denies pain     Current Medications & Allergies (verified) Allergies as of 05/16/2023   No Known Allergies      Medication List        Accurate as of May 16, 2023  3:09 PM. If you have any questions, ask your nurse or doctor.          STOP taking these medications    doxycycline 100 MG tablet Commonly known as: VIBRA-TABS       TAKE these medications    atorvastatin 20 MG tablet Commonly known as: LIPITOR Take 1 tablet by mouth once daily   gabapentin 300 MG capsule Commonly known as: NEURONTIN TAKE 1 CAPSULE BY MOUTH EVERY DAY AT BEDTIME   Glucosamine 1500 Complex Caps Take by mouth.   ipratropium 0.06 % nasal  spray Commonly known as: ATROVENT Place 2 sprays into both nostrils 3 (three) times daily.   meloxicam 15 MG tablet Commonly known as: MOBIC TAKE 1/2 TO 1 (ONE-HALF TO ONE) TABLET BY MOUTH ONCE DAILY AS NEEDED FOR PAIN   multivitamin tablet Take 1 tablet by mouth daily.   Olopatadine HCl 0.2 % Soln Commonly known as: Pataday Apply 1-2 drops to eye in the morning and at bedtime.   Restasis 0.05 % ophthalmic emulsion Generic drug: cycloSPORINE 1 drop 2 (two) times daily.        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   Prostate cancer [facility])    Past Surgical History:  Procedure Laterality Date   PROSTATECTOMY     Family History  Problem Relation Age of Onset   Cancer Father    Prostate cancer Father    Colon cancer Father    Social History   Socioeconomic History   Marital status: Married    Spouse name: Clydie Braun Des'Sc[place]iolo   Number of children: 3   Years of education: 15  Highest education level: Associate degree: academic program  Occupational History   Occupation: Retired  Tobacco Use   Smoking status: Never   Smokeless tobacco: Never  Vaping Use   Vaping Use: Never used  Substance and Sexual Activity   Alcohol use: Yes    Comment: Rare   Drug use: Not Currently   Sexual activity: Yes    Partners: Female  Other Topics Concern   Not on file  Social History Narrative   Lives with his wife. They have three children. He likes to do yardwork in his free time.    Social Determinants of Health   Financial Resource Strain: Low Risk  (05/16/2023)   Overall Financial Resource Strain (CARDIA)    Difficulty of Paying Living Expenses: Not hard at all  Food Insecurity:  No Food Insecurity (05/16/2023)   Hunger Vital Sign    Worried About Running Out of Food in the Last Year: Never true    Ran Out of Food in the Last Year: Never true  Transportation Needs: No Transportation Needs (05/16/2023)   PRAPARE - Administrator, Civil Service (Medical): No    Lack of Transportation (Non-Medical): No  Physical Activity: Inactive (05/16/2023)   Exercise Vital Sign    Days of Exercise per Week: 0 days    Minutes of Exercise per Session: 0 min  Stress: No Stress Concern Present (05/16/2023)   Brian [facility] [employer] - Occupational Stress Questionnaire    Feeling of Stress : Not at all  Social Connections: Moderately Isolated (05/16/2023)   Social Connection and Isolation Panel [NHANES]    Frequency of Communication with Friends and Family: Three times a week    Frequency of Social Gatherings with Friends and Family: Once a week    Attends Religious Services: Never    Database administrator or Organizations: No    Attends Banker Meetings: Never    Marital Status: Married    Activities of Daily Living    05/16/2023    2:36 PM  In your present state of health, do you have any difficulty performing the following activities:  Hearing? 0  Vision? 0  Difficulty concentrating or making decisions? 0  Walking or climbing stairs? 0  Dressing or bathing? 0  Doing errands, shopping? 0  Preparing Food and eating ? N  Using the Toilet? N  In the past six months, have you accidently leaked urine? Y  Comment hx of prostatectomy  Do you have problems with loss of bowel control? N  Managing your Medications? N  Managing your Finances? N  Housekeeping or managing your Housekeeping? N    Patient Education/Literacy How often do you need to have someone help you when you read instructions, pamphlets, or other written materials from your doctor or pharmacy?: 1 - Never What is the last grade level you completed in school?: Associates  degree  Exercise Current Exercise Habits: The patient does not participate in regular exercise at present, Exercise limited by: None identified  Diet Patient reports consuming 3 meals a day and 0-1 snack(s) a day Patient reports that his primary diet is: Regular Patient reports that she does have regular access to food.   Depression Screen    05/16/2023    2:35 PM 02/15/2023    3:16 PM 04/10/2022   10:40 AM 04/08/2021   10:13 AM 03/25/2020    2:18 PM  PHQ 2/9 Scores  PHQ - 2 Score 0 0 0 0 0  PHQ- 9 Score     6     Fall Risk    05/16/2023    2:35 PM 02/15/2023    3:16 PM 04/10/2022   10:39 AM 04/08/2021   10:01 AM 03/25/2020    2:17 PM  Fall Risk   Falls in the past year? 0 0 0 0 0  Number falls in past yr: 0 0 0 0 0  Injury with Fall? 0 0 0 0 0  Risk for fall due to : No Fall Risks No Fall Risks No Fall Risks No Fall Risks   Follow up Falls evaluation completed Falls evaluation completed Falls evaluation completed Falls evaluation completed      Objective:   BP 122/61 (BP Location: Right Arm, Patient Position: Sitting, Cuff Size: Normal)   Pulse 71   Ht 5\' 5"  (1.651 m)   Wt 201 lb 0.6 oz (91.2 kg)   SpO2 95%   BMI 33.45 kg/m   Last Weight  Most recent update: 05/16/2023  2:30 PM    Weight  91.2 kg (201 lb 0.6 oz)             Body mass index is 33.45 kg/m.  Hearing/Vision  Brian Donovan did not have difficulty with hearing/understanding during the face-to-face interview Brian Donovan did not have difficulty with his vision during the face-to-face interview Reports that he has had a formal eye exam by an eye care professional within the past year Reports that he has not had a formal hearing evaluation within the past year  Cognitive Function:    05/16/2023    3:06 PM 04/10/2022   10:31 AM 04/08/2021   10:21 AM  6CIT Screen  What Year? 0 points 0 points 0 points  What month? 0 points 0 points 0 points  What time? 0 points 0 points 0 points  Count back from 20 0 points 0 points 0  points  Months in reverse 0 points 0 points 0 points  Repeat phrase 2 points 0 points 0 points  Total Score 2 points 0 points 0 points    Normal Cognitive Function Screening: Yes (Normal:0-7, Significant for Dysfunction: >8)  Immunization & Health Maintenance Record Immunization History  Administered Date(s) Administered   COVID-19, mRNA, vaccine(Comirnaty)12 years and older 09/27/2022   Fluad Quad(high Dose 65+) 09/23/2020, 10/04/2021, 09/27/2022   H1N1 12/17/2008   Influenza Inj Mdck Quad Pf 08/21/2017, 09/13/2018   Influenza, Seasonal, Injecte, Preservative Fre 10/08/2009, 09/19/2010, 09/19/2011, 09/17/2012, 10/28/2013, 09/18/2014   Influenza,inj,Quad PF,6+ Mos 09/03/2015, 09/12/2016, 10/01/2019   Influenza-Unspecified 10/04/1995, 10/16/1997, 10/16/2002, 10/06/2004, 11/01/2005, 10/12/2006, 10/15/2007   PFIZER(Purple Top)SARS-COV-2 Vaccination 02/09/2020, 03/01/2020, 09/24/2020, 09/27/2022   PNEUMOCOCCAL CONJUGATE-20 04/08/2021   PPD Test 03/18/2014   Pfizer Covid-19 Vaccine Bivalent Booster 42yrs & up 10/04/2021   Pneumococcal Conjugate-13 07/30/2015   Pneumococcal Polysaccharide-23 05/23/2007   Td 09/15/1998   Tdap 09/19/2010   Zoster Recombinat (Shingrix) 09/27/2022   Zoster, Live 12/22/2008, 09/17/2012    Health Maintenance  Topic Date Due   DTaP/Tdap/Td (3 - Td or Tdap) 09/19/2020   Zoster Vaccines- Shingrix (2 of 2) 08/16/2023 (Originally 11/22/2022)   INFLUENZA VACCINE  07/19/2023   Medicare Annual Wellness ([facility])  05/15/2024   Colonoscopy  05/18/2024   Pneumonia Vaccine 31+ Years old  Completed   COVID-19 Vaccine  Completed   HPV VACCINES  Aged Out       Assessment  This is a routine wellness examination for Brian Donovan.  Health Maintenance: Due or Overdue Health Maintenance Due  Topic  Date Due   DTaP/Tdap/Td (3 - Td or Tdap) 09/19/2020    Brian Donovan does not need a referral for [employer] Assistance: Care Management:   no Social  Work:    no Prescription Assistance:  no Nutrition/Diabetes Education:  no   Plan:  Personalized Goals  Goals Addressed               This Visit's Progress     Patient Stated (pt-stated)        Patient stated that he would like to loose weight.       Personalized Health Maintenance & Screening Recommendations  Td vaccine 2nd dose of shingles vaccine  Lung Cancer Screening Recommended: no (Low Dose CT Chest recommended if Age 78-80 years, 30 pack-year currently smoking OR have quit w/in past 15 years) Hepatitis C Screening recommended: no HIV Screening recommended: no  Advanced Directives: Written information was not given per the patient's request.  Referrals & Orders No orders of the defined types were placed in this encounter.   Follow-up Plan Follow-up with Everrett Coombe, DO as planned Please send in the dates for the tetanus and shingles vaccines.  Medicare wellness visit in one year. AVS printed and given to the patient.   I have personally reviewed and noted the following in the patient's chart:   Medical and social history Use of alcohol, tobacco or illicit drugs  Current medications and supplements Functional ability and status Nutritional status Physical activity Advanced directives List of other physicians Hospitalizations, surgeries, and ER visits in previous 12 months Vitals Screenings to include cognitive, depression, and falls Referrals and appointments  In addition, I have reviewed and discussed with patient certain preventive protocols, quality metrics, and best practice recommendations. A written personalized care plan for preventive services as well as general preventive health recommendations were provided to patient.     Modesto Charon, RN BSN  05/16/2023

## 2023-05-16 NOTE — Patient Instructions (Addendum)
MEDICARE ANNUAL WELLNESS VISIT Health Maintenance Summary and Written Plan of Care  Brian Donovan ,  Thank you for allowing me to perform your Medicare Annual Wellness Visit and for your ongoing commitment to your health.   Health Maintenance & Immunization History Health Maintenance  Topic Date Due   DTaP/Tdap/Td (3 - Td or Tdap) 09/19/2020   Zoster Vaccines- Shingrix (2 of 2) 11/22/2022   INFLUENZA VACCINE  07/19/2023   Medicare Annual Wellness (AWV)  05/15/2024   Colonoscopy  05/18/2024   Pneumonia Vaccine 85+ Years old  Completed   COVID-19 Vaccine  Completed   HPV VACCINES  Aged Out   Immunization History  Administered Date(s) Administered   COVID-19, mRNA, vaccine(Comirnaty)12 years and older 09/27/2022   Fluad Quad(high Dose 65+) 09/23/2020, 10/04/2021, 09/27/2022   H1N1 12/17/2008   Influenza Inj Mdck Quad Pf 08/21/2017, 09/13/2018   Influenza, Seasonal, Injecte, Preservative Fre 10/08/2009, 09/19/2010, 09/19/2011, 09/17/2012, 10/28/2013, 09/18/2014   Influenza,inj,Quad PF,6+ Mos 09/03/2015, 09/12/2016, 10/01/2019   Influenza-Unspecified 10/04/1995, 10/16/1997, 10/16/2002, 10/06/2004, 11/01/2005, 10/12/2006, 10/15/2007   PFIZER(Purple Top)SARS-COV-2 Vaccination 02/09/2020, 03/01/2020, 09/24/2020, 09/27/2022   PNEUMOCOCCAL CONJUGATE-20 04/08/2021   PPD Test 03/18/2014   Pfizer Covid-19 Vaccine Bivalent Booster 64yrs & up 10/04/2021   Pneumococcal Conjugate-13 07/30/2015   Pneumococcal Polysaccharide-23 05/23/2007   Td 09/15/1998   Tdap 09/19/2010   Zoster Recombinat (Shingrix) 09/27/2022   Zoster, Live 12/22/2008, 09/17/2012    These are the patient goals that we discussed:  Goals Addressed               This Visit's Progress     Patient Stated (pt-stated)        Patient stated that he would like to loose weight.         This is a list of Health Maintenance Items that are overdue or due now: Td vaccine 2nd dose of shingles  vaccine  Orders/Referrals Placed Today: No orders of the defined types were placed in this encounter.  (Contact our referral department at [phone] if you have not spoken with someone about your referral appointment within the next 5 days)    Follow-up Plan Follow-up with Everrett Coombe, DO as planned Please send in the dates for the tetanus and shingles vaccines.  Medicare wellness visit in one year. AVS printed and given to the patient.      Health Maintenance, Male Adopting a healthy lifestyle and getting preventive care are important in promoting health and wellness. Ask your health care provider about: The right schedule for you to have regular tests and exams. Things you can do on your own to prevent diseases and keep yourself healthy. What should I know about diet, weight, and exercise? Eat a healthy diet  Eat a diet that includes plenty of vegetables, fruits, low-fat dairy products, and lean protein. Do not eat a lot of foods that are high in solid fats, added sugars, or sodium. Maintain a healthy weight Body mass index (BMI) is a measurement that can be used to identify possible weight problems. It estimates body fat based on height and weight. Your health care provider can help determine your BMI and help you achieve or maintain a healthy weight. Get regular exercise Get regular exercise. This is one of the most important things you can do for your health. Most adults should: Exercise for at least 150 minutes each week. The exercise should increase your heart rate and make you sweat (moderate-intensity exercise). Do strengthening exercises at least twice a week. This is in  addition to the moderate-intensity exercise. Spend less time sitting. Even light physical activity can be beneficial. Watch cholesterol and blood lipids Have your blood tested for lipids and cholesterol at 82 years of age, then have this test every 5 years. You may need to have your cholesterol  levels checked more often if: Your lipid or cholesterol levels are high. You are older than 82 years of age. You are at high risk for heart disease. What should I know about cancer screening? Many types of cancers can be detected early and may often be prevented. Depending on your health history and family history, you may need to have cancer screening at various ages. This may include screening for: Colorectal cancer. Prostate cancer. Skin cancer. Lung cancer. What should I know about heart disease, diabetes, and high blood pressure? Blood pressure and heart disease High blood pressure causes heart disease and increases the risk of stroke. This is more likely to develop in people who have high blood pressure readings or are overweight. Talk with your health care provider about your target blood pressure readings. Have your blood pressure checked: Every 3-5 years if you are 32-69 years of age. Every year if you are 6 years old or older. If you are between the ages of 26 and 63 and are a current or former smoker, ask your health care provider if you should have a one-time screening for abdominal aortic aneurysm (AAA). Diabetes Have regular diabetes screenings. This checks your fasting blood sugar level. Have the screening done: Once every three years after age 60 if you are at a normal weight and have a low risk for diabetes. More often and at a younger age if you are overweight or have a high risk for diabetes. What should I know about preventing infection? Hepatitis B If you have a higher risk for hepatitis B, you should be screened for this virus. Talk with your health care provider to find out if you are at risk for hepatitis B infection. Hepatitis C Blood testing is recommended for: Everyone born from [birth year] through 1965. Anyone with known risk factors for hepatitis C. Sexually transmitted infections (STIs) You should be screened each year for STIs, including gonorrhea and chlamydia,  if: You are sexually active and are younger than 82 years of age. You are older than 82 years of age and your health care provider tells you that you are at risk for this type of infection. Your sexual activity has changed since you were last screened, and you are at increased risk for chlamydia or gonorrhea. Ask your health care provider if you are at risk. Ask your health care provider about whether you are at high risk for HIV. Your health care provider may recommend a prescription medicine to help prevent HIV infection. If you choose to take medicine to prevent HIV, you should first get tested for HIV. You should then be tested every 3 months for as long as you are taking the medicine. Follow these instructions at home: Alcohol use Do not drink alcohol if your health care provider tells you not to drink. If you drink alcohol: Limit how much you have to 0-2 drinks a day. Know how much alcohol is in your drink. In the [place] one drink equals one 12 oz bottle of beer (355 mL), one 5 oz glass of wine (148 mL), or one 1 oz glass of hard liquor (44 mL). Lifestyle Do not use any products that contain nicotine or tobacco. These products include cigarettes,  chewing tobacco, and vaping devices, such as e-cigarettes. If you need help quitting, ask your health care provider. Do not use street drugs. Do not share needles. Ask your health care provider for help if you need support or information about quitting drugs. General instructions Schedule regular health, dental, and eye exams. Stay current with your vaccines. Tell your health care provider if: You often feel depressed. You have ever been abused or do not feel safe at home. Summary Adopting a healthy lifestyle and getting preventive care are important in promoting health and wellness. Follow your health care provider's instructions about healthy diet, exercising, and getting tested or screened for diseases. Follow your health care provider's  instructions on monitoring your cholesterol and blood pressure. This information is not intended to replace advice given to you by your health care provider. Make sure you discuss any questions you have with your health care provider. Document Revised: 04/25/2021 Document Reviewed: 04/25/2021 Elsevier Patient Education  2024 [employer].

## 2023-05-30 ENCOUNTER — Encounter: Payer: Medicare Other | Admitting: Family Medicine

## 2023-06-19 ENCOUNTER — Telehealth: Payer: Self-pay | Admitting: Family Medicine

## 2023-06-19 ENCOUNTER — Other Ambulatory Visit: Payer: Self-pay | Admitting: Family Medicine

## 2023-06-19 DIAGNOSIS — E782 Mixed hyperlipidemia: Secondary | ICD-10-CM

## 2023-06-19 DIAGNOSIS — G2581 Restless legs syndrome: Secondary | ICD-10-CM | POA: Insufficient documentation

## 2023-06-19 DIAGNOSIS — N39498 Other specified urinary incontinence: Secondary | ICD-10-CM

## 2023-06-19 NOTE — Telephone Encounter (Signed)
Patient request labs prior to CPE. Please order. 

## 2023-06-23 LAB — COMPREHENSIVE METABOLIC PANEL
AG Ratio: 1.7 (calc) (ref 1.0–2.5)
ALT: 18 U/L (ref 9–46)
AST: 21 U/L (ref 10–35)
Albumin: 4 g/dL (ref 3.6–5.1)
Alkaline phosphatase (APISO): 84 U/L (ref 35–144)
BUN/Creatinine Ratio: 25 (calc) — ABNORMAL HIGH (ref 6–22)
BUN: 26 mg/dL — ABNORMAL HIGH (ref 7–25)
CO2: 26 mmol/L (ref 20–32)
Calcium: 8.8 mg/dL (ref 8.6–10.3)
Chloride: 108 mmol/L (ref 98–110)
Creat: 1.03 mg/dL (ref 0.70–1.22)
Globulin: 2.3 g/dL (calc) (ref 1.9–3.7)
Glucose, Bld: 105 mg/dL — ABNORMAL HIGH (ref 65–99)
Potassium: 4.9 mmol/L (ref 3.5–5.3)
Sodium: 142 mmol/L (ref 135–146)
Total Bilirubin: 0.5 mg/dL (ref 0.2–1.2)
Total Protein: 6.3 g/dL (ref 6.1–8.1)

## 2023-06-23 LAB — LIPID PANEL W/REFLEX DIRECT LDL
Cholesterol: 132 mg/dL (ref <200)
HDL: 48 mg/dL (ref >=40)
LDL Cholesterol (Calc): 68 mg/dL (calc)
Non-HDL Cholesterol (Calc): 84 mg/dL (calc) (ref <130)
Total CHOL/HDL Ratio: 2.8 (calc) (ref <5.0)
Triglycerides: 75 mg/dL (ref <150)

## 2023-06-26 ENCOUNTER — Encounter: Payer: Self-pay | Admitting: Family Medicine

## 2023-06-26 ENCOUNTER — Ambulatory Visit (INDEPENDENT_AMBULATORY_CARE_PROVIDER_SITE_OTHER): Payer: Medicare Other | Admitting: Family Medicine

## 2023-06-26 VITALS — BP 126/66 | HR 64 | Ht 65.0 in | Wt 201.0 lb

## 2023-06-26 DIAGNOSIS — Z0000 Encounter for general adult medical examination without abnormal findings: Secondary | ICD-10-CM | POA: Diagnosis not present

## 2023-06-26 DIAGNOSIS — G2581 Restless legs syndrome: Secondary | ICD-10-CM | POA: Diagnosis not present

## 2023-06-26 DIAGNOSIS — E782 Mixed hyperlipidemia: Secondary | ICD-10-CM | POA: Diagnosis not present

## 2023-06-26 LAB — CBC WITH DIFFERENTIAL/PLATELET
Absolute Monocytes: 737 cells/uL (ref 200–950)
Eosinophils Absolute: 57 cells/uL (ref 15–500)
Eosinophils Relative: 0.9 %
HCT: 45.5 % (ref 38.5–50.0)
Hemoglobin: 14.7 g/dL (ref 13.2–17.1)
MCH: 29.8 pg (ref 27.0–33.0)
MCHC: 32.3 g/dL (ref 32.0–36.0)
MCV: 92.1 fL (ref 80.0–100.0)
MPV: 10.8 fL (ref 7.5–12.5)
Monocytes Relative: 11.7 %
Neutro Abs: 3635 cells/uL (ref 1500–7800)
Neutrophils Relative %: 57.7 %
Platelets: 222 10*3/uL (ref 140–400)
RBC: 4.94 10*6/uL (ref 4.20–5.80)
RDW: 12.9 % (ref 11.0–15.0)
WBC: 6.3 10*3/uL (ref 3.8–10.8)

## 2023-06-26 NOTE — Patient Instructions (Signed)
Preventive Care 65 Years and Older, Male Preventive care refers to lifestyle choices and visits with your health care provider that can promote health and wellness. Preventive care visits are also called wellness exams. What can I expect for my preventive care visit? Counseling During your preventive care visit, your health care provider may ask about your: Medical history, including: Past medical problems. Family medical history. History of falls. Current health, including: Emotional well-being. Home life and relationship well-being. Sexual activity. Memory and ability to understand (cognition). Lifestyle, including: Alcohol, nicotine or tobacco, and drug use. Access to firearms. Diet, exercise, and sleep habits. Work and work environment. Sunscreen use. Safety issues such as seatbelt and bike helmet use. Physical exam Your health care provider will check your: Height and weight. These may be used to calculate your BMI (body mass index). BMI is a measurement that tells if you are at a healthy weight. Waist circumference. This measures the distance around your waistline. This measurement also tells if you are at a healthy weight and may help predict your risk of certain diseases, such as type 2 diabetes and high blood pressure. Heart rate and blood pressure. Body temperature. Skin for abnormal spots. What immunizations do I need?  Vaccines are usually given at various ages, according to a schedule. Your health care provider will recommend vaccines for you based on your age, medical history, and lifestyle or other factors, such as travel or where you work. What tests do I need? Screening Your health care provider may recommend screening tests for certain conditions. This may include: Lipid and cholesterol levels. Diabetes screening. This is done by checking your blood sugar (glucose) after you have not eaten for a while (fasting). Hepatitis C test. Hepatitis B test. HIV (human  immunodeficiency virus) test. STI (sexually transmitted infection) testing, if you are at risk. Lung cancer screening. Colorectal cancer screening. Prostate cancer screening. Abdominal aortic aneurysm (AAA) screening. You may need this if you are a current or former smoker. Talk with your health care provider about your test results, treatment options, and if necessary, the need for more tests. Follow these instructions at home: Eating and drinking  Eat a diet that includes fresh fruits and vegetables, whole grains, lean protein, and low-fat dairy products. Limit your intake of foods with high amounts of sugar, saturated fats, and salt. Take vitamin and mineral supplements as recommended by your health care provider. Do not drink alcohol if your health care provider tells you not to drink. If you drink alcohol: Limit how much you have to 0-2 drinks a day. Know how much alcohol is in your drink. In the [place] one drink equals one 12 oz bottle of beer (355 mL), one 5 oz glass of wine (148 mL), or one 1 oz glass of hard liquor (44 mL). Lifestyle Brush your teeth every morning and night with fluoride toothpaste. Floss one time each day. Exercise for at least 30 minutes 5 or more days each week. Do not use any products that contain nicotine or tobacco. These products include cigarettes, chewing tobacco, and vaping devices, such as e-cigarettes. If you need help quitting, ask your health care provider. Do not use drugs. If you are sexually active, practice safe sex. Use a condom or other form of protection to prevent STIs. Take aspirin only as told by your health care provider. Make sure that you understand how much to take and what form to take. Work with your health care provider to find out whether it is safe   and beneficial for you to take aspirin daily. Ask your health care provider if you need to take a cholesterol-lowering medicine (statin). Find healthy ways to manage stress, such  as: Meditation, yoga, or listening to music. Journaling. Talking to a trusted person. Spending time with friends and family. Safety Always wear your seat belt while driving or riding in a vehicle. Do not drive: If you have been drinking alcohol. Do not ride with someone who has been drinking. When you are tired or distracted. While texting. If you have been using any mind-altering substances or drugs. Wear a helmet and other protective equipment during sports activities. If you have firearms in your house, make sure you follow all gun safety procedures. Minimize exposure to UV radiation to reduce your risk of skin cancer. What's next? Visit your health care provider once a year for an annual wellness visit. Ask your health care provider how often you should have your eyes and teeth checked. Stay up to date on all vaccines. This information is not intended to replace advice given to you by your health care provider. Make sure you discuss any questions you have with your health care provider. Document Revised: 06/01/2021 Document Reviewed: 06/01/2021 Elsevier Patient Education  2024 Elsevier Inc.  

## 2023-06-26 NOTE — Assessment & Plan Note (Signed)
Well adult Orders Placed This Encounter  Procedures   CBC with Differential   Iron, TIBC and Ferritin Panel   TSH  Screenings: Per lab orders Immunizations: Up-to-date Anticipatory guidance/risk factor reduction: Recommendations per AVS

## 2023-06-26 NOTE — Progress Notes (Signed)
Male Bos - 82 y.o. male MRN [id number]  Date of birth: 10-26-1941  Subjective Chief Complaint  Patient presents with   Annual Exam    HPI .Brian Donovan is a 82 y.o. male here today for annual exam.   He reports that he is doing pretty well.Marland Kitchen  He does experience some intermittent dizziness at times.  He thinks this may be related to how well he is hydrating.  His activity level is moderate.  He feels that diet is pretty good.    He is a non-smoker.  Rare EtOH use.   Review of Systems  Constitutional:  Negative for chills, fever, malaise/fatigue and weight loss.  HENT:  Negative for congestion, ear pain and sore throat.   Eyes:  Negative for blurred vision, double vision and pain.  Respiratory:  Negative for cough and shortness of breath.   Cardiovascular:  Negative for chest pain and palpitations.  Gastrointestinal:  Negative for abdominal pain, blood in stool, constipation, heartburn and nausea.  Genitourinary:  Negative for dysuria and urgency.  Musculoskeletal:  Negative for joint pain and myalgias.  Neurological:  Negative for dizziness and headaches.  Endo/Heme/Allergies:  Does not bruise/bleed easily.  Psychiatric/Behavioral:  Negative for depression. The patient is not nervous/anxious and does not have insomnia.     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   Prostate cancer (HCC)     Past Surgical History:  Procedure Laterality Date   PROSTATECTOMY      Social History   Socioeconomic History   Marital status: Married    Spouse name: Clydie Braun Des'Scisciolo   Number of children: 3   Years of education: 15   Highest education level: Associate degree: academic  program  Occupational History   Occupation: Retired  Tobacco Use   Smoking status: Never   Smokeless tobacco: Never  Building services engineer Use: Never used  Substance and Sexual Activity   Alcohol use: Yes    Comment: Rare   Drug use: Not Currently   Sexual activity: Yes    Partners: Female  Other Topics Concern   Not on file  Social History Narrative   Lives with his wife. They have three children. He likes to do yardwork in his free time.    Social Determinants of Health   Financial Resource Strain: Low Risk  (05/16/2023)   Overall Financial Resource Strain (CARDIA)    Difficulty of Paying Living Expenses: Not hard at all  Food Insecurity: No Food Insecurity (05/16/2023)   Hunger Vital Sign    Worried About Running Out of Food in the Last Year: Never true    Ran Out of Food in the Last Year: Never true  Transportation Needs: No Transportation Needs (05/16/2023)   PRAPARE - Administrator, Civil Service (Medical): No    Lack of Transportation (Non-Medical): No  Physical Activity: Inactive (05/16/2023)   Exercise Vital Sign    Days of Exercise per Week: 0 days    Minutes of Exercise per Session: 0 min  Stress: No Stress Concern Present (05/16/2023)   [employer] of Occupational Health - Occupational Stress Questionnaire    Feeling of Stress : Not at all  Social Connections: Moderately Isolated (05/16/2023)   Social Connection and Isolation Panel [NHANES]    Frequency of Communication with Friends and Family: Three times a week    Frequency of Social Gatherings with Friends and Family: Once a week    Attends Religious Services: Never    Database administrator or Organizations: No    Attends Engineer, structural: Never    Marital Status: Married    Family History  Problem Relation Age of Onset   Cancer Father    Prostate cancer Father    Colon cancer Father     Health Maintenance  Topic Date Due   INFLUENZA VACCINE  07/19/2023   Medicare  Annual Wellness (AWV)  05/15/2024   Colonoscopy  05/18/2024   DTaP/Tdap/Td (4 - Td or Tdap) 12/13/2032   Pneumonia Vaccine 40+ Years old  Completed   COVID-19 Vaccine  Completed   Zoster Vaccines- Shingrix  Completed   HPV VACCINES  Aged Out     ----------------------------------------------------------------------------------------------------------------------------------------------------------------------------------------------------------------- Physical Exam BP 126/66 (BP Location: Left Arm, Patient Position: Sitting, Cuff Size: Large)   Pulse 64   Ht 5\' 5"  (1.651 m)   Wt 201 lb (91.2 kg)   SpO2 97%   BMI 33.45 kg/m   Physical Exam Constitutional:      General: He is not in acute distress. HENT:     Head: Normocephalic and atraumatic.     Right Ear: Tympanic membrane and external ear normal.     Left Ear: Tympanic membrane and external ear normal.  Eyes:     General: No scleral icterus. Neck:     Thyroid: No thyromegaly.  Cardiovascular:     Rate and Rhythm: Normal rate and regular rhythm.     Heart sounds: Normal heart sounds.  Pulmonary:     Effort: Pulmonary effort is normal.     Breath sounds: Normal breath sounds.  Abdominal:     General: Bowel sounds are normal. There is no distension.     Palpations: Abdomen is soft.     Tenderness: There is no abdominal tenderness. There is no guarding.  Musculoskeletal:     Cervical back: Normal range of motion.  Lymphadenopathy:     Cervical: No cervical adenopathy.  Skin:    General: Skin is warm and dry.     Findings: No rash.  Neurological:     Mental Status: He is alert and oriented to person, place, and time.     Cranial Nerves: No cranial nerve deficit.     Motor: No abnormal muscle tone.  Psychiatric:        Mood and Affect: Mood normal.        Behavior: Behavior normal.      ------------------------------------------------------------------------------------------------------------------------------------------------------------------------------------------------------------------- Assessment and Plan  Well adult exam Well adult Orders Placed This Encounter  Procedures   CBC with Differential   Iron, TIBC and Ferritin Panel   TSH  Screenings: Per lab orders Immunizations: Up-to-date Anticipatory guidance/risk factor reduction: Recommendations per AVS   No orders of the defined types were placed in this encounter.   Return in about 6 months (around 12/27/2023) for HLD.    This visit occurred during the SARS-CoV-2 public health emergency.  Safety protocols were in place, including screening questions prior to the visit, additional usage of staff PPE, and extensive cleaning of exam room while observing appropriate contact time as indicated for disinfecting solutions.

## 2023-06-27 LAB — IRON,TIBC AND FERRITIN PANEL
%SAT: 26 % (calc) (ref 20–48)
Ferritin: 206 ng/mL (ref 24–380)
Iron: 74 ug/dL (ref 50–180)
TIBC: 281 mcg/dL (calc) (ref 250–425)

## 2023-06-27 LAB — CBC WITH DIFFERENTIAL/PLATELET
Basophils Absolute: 50 cells/uL (ref 0–200)
Basophils Relative: 0.8 %
Lymphs Abs: 1821 cells/uL (ref 850–3900)
Total Lymphocyte: 28.9 %

## 2023-06-27 LAB — TSH: TSH: 3.42 mIU/L (ref 0.40–4.50)

## 2023-11-05 ENCOUNTER — Other Ambulatory Visit: Payer: Self-pay | Admitting: Family Medicine

## 2023-12-03 ENCOUNTER — Telehealth: Payer: Self-pay

## 2023-12-03 NOTE — Telephone Encounter (Signed)
Patient advised to go to the ED ASAP for evaluation. Advised to have someone drive him.    Appointment for: Brian Donovan ([id number]) Visit type: OFFICE VISIT [phone]) 12/10/2023 10:50 AM (20 minutes) with Everrett Coombe in [facility]   Patient comments: dizzy when lying on my left side. Periodic tinging on left side of face. Difficultly balancing when getting out of bed.

## 2023-12-10 ENCOUNTER — Ambulatory Visit: Payer: Medicare Other | Admitting: Family Medicine

## 2024-01-02 ENCOUNTER — Ambulatory Visit (INDEPENDENT_AMBULATORY_CARE_PROVIDER_SITE_OTHER): Payer: Medicare Other | Admitting: Sports Medicine

## 2024-01-02 ENCOUNTER — Encounter: Payer: Self-pay | Admitting: Sports Medicine

## 2024-01-02 ENCOUNTER — Ambulatory Visit: Payer: Medicare Other

## 2024-01-02 DIAGNOSIS — M7541 Impingement syndrome of right shoulder: Secondary | ICD-10-CM

## 2024-01-02 DIAGNOSIS — M25511 Pain in right shoulder: Secondary | ICD-10-CM

## 2024-01-02 NOTE — Progress Notes (Signed)
    Procedures performed today:    None.  Independent interpretation of notes and tests performed by another provider:   None.  Brief History, Exam, Impression, and Recommendations:    Impingement syndrome, shoulder, right Several months of increasing pain right shoulder localized of the deltoid worse with abduction, positive impingement signs on exam without weakness, suspect shoulder bursitis, adding x-rays, he has meloxicam  at home, he will do this for 2 weeks and then as needed, formal PT, return to see me in 6 weeks. Subacromial injection if not better.    ____________________________________________ Joselyn Nicely. Sandy Crumb, M.D., ABFM., CAQSM., AME. Primary Care and Sports Medicine [facility]  Adjunct Professor of [facility] Medicine  [employer]  Restaurant manager, fast food

## 2024-01-02 NOTE — Assessment & Plan Note (Addendum)
 Several months of increasing pain right shoulder localized of the deltoid worse with abduction, positive impingement signs on exam without weakness, suspect shoulder bursitis, adding x-rays, he has meloxicam  at home, he will do this for 2 weeks and then as needed, formal PT, return to see me in 6 weeks. Subacromial injection if not better.

## 2024-01-07 NOTE — Therapy (Signed)
OUTPATIENT PHYSICAL THERAPY SHOULDER EVALUATION   Patient Name: Brian Donovan MRN: [id number] DOB:1941/11/20, 83 y.o., male Today's Date: 01/08/2024  END OF SESSION:  PT End of Session - 01/08/24 1011     Visit Number 1    Number of Visits 9    Date for PT Re-Evaluation 03/04/24    Authorization Type [employer] MDC    Authorization Time Period auth tbd    PT Start Time 1012    PT Stop Time 1101    PT Time Calculation (min) 49 min    Activity Tolerance Patient tolerated treatment well             Past Medical History:  Diagnosis Date   Cataracts, both eyes    Colon polyps    High cholesterol    History of colon polyps 03/25/2020   History of prostate cancer 03/25/2020   History of prostatectomy 03/25/2020   Mixed hyperlipidemia 03/25/2020   Primary osteoarthritis involving multiple joints 03/25/2020   Prostate cancer [facility])    Past Surgical History:  Procedure Laterality Date   PROSTATECTOMY     Patient Active Problem List   Diagnosis Date Noted   Impingement syndrome, shoulder, right 01/02/2024   Well adult exam 06/26/2023   Restless legs 06/19/2023   Acute maxillary sinusitis 02/15/2023   Urinary incontinence 05/29/2022   Injury of left shoulder 06/14/2021   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   Lumbar spondylosis 04/29/2021   History of prostate cancer 03/25/2020   History of prostatectomy 03/25/2020   History of colon polyps 03/25/2020   Mixed hyperlipidemia 03/25/2020   Primary osteoarthritis involving multiple joints 03/25/2020    PCP: Everrett Coombe, DO  REFERRING PROVIDER: Monica Becton, MD  REFERRING DIAG: M75.41 (ICD-10-CM) - Impingement syndrome, shoulder, right  THERAPY DIAG:  Right shoulder pain, unspecified chronicity  Muscle weakness (generalized)  Rationale for Evaluation and Treatment: Rehabilitation  ONSET DATE: End of September  SUBJECTIVE:                                                                                                                                                                                       SUBJECTIVE STATEMENT: Pt states in end of September 2024, he was changing a smoke detector overhead, was reaching with his R arm. Didn't bother him in the moment but pain started the next day. Felt it was going to self resolve but so far hasn't changed much for better or worse. Bothers him at night, wakes occasionally when he rolls onto it. Tends to do okay during the day when he's less active. Denies overt limitations due to his shoulder but does feel it is weaker.  Notices particular difficulty with lifting objects overhead. He describes his most concordant pain with a combination of shoulder extension/elevation/ER (back end of pitching position)  No N/T but does have some chronic hand stiffness he feels is arthritic, unchanged.  Hand dominance: Right  PERTINENT HISTORY: cataracts, hx prostate cancer w/ prostatectomy, urinary incontinence  PAIN:  Are you having pain: 1/10 Location/description: deep in R shoulder, anterior Best-worst over past week: up to 9/10, settles quickly  - aggravating factors: reaching backwards, reaching overhead and external rotating  - Easing factors: getting out of provocative position    PRECAUTIONS: cancer hx  WEIGHT BEARING RESTRICTIONS: No  FALLS:  Has patient fallen in last 6 months? No  LIVING ENVIRONMENT: 1 story house, 5-6 STE Lives w/ spouse  OCCUPATION: Retired - worked for [employer]  PLOF: Independent  PATIENT GOALS: wants to try to maximize shoulder as well as he can  NEXT MD VISIT: end of february  OBJECTIVE:  Note: Objective measures were completed at Evaluation unless otherwise noted.  DIAGNOSTIC FINDINGS:  XR R shoulder 01/02/24, no radiology read yet in EPIC at time of eval  PATIENT SURVEYS:  FOTO 63 > 68  COGNITION: Overall cognitive status: Within functional limits for tasks assessed     SENSATION: Denies sensory  complaints  POSTURE: Mildly rounded shoulders, inc kyphosis, and forward head. Able to correct  UPPER EXTREMITY ROM:  A/PROM Right eval Left eval  Shoulder flexion 130 deg 138 deg  Shoulder abduction 128 deg 140 deg  Shoulder internal rotation (functional combination) T11 (inc time/effort) T11  Shoulder external rotation (functional combination) C5 C7  Elbow flexion    Elbow extension    Wrist flexion    Wrist extension     (Blank rows = not tested) (Key: WFL = within functional limits not formally assessed, * = concordant pain, s = stiffness/stretching sensation, NT = not tested)  Comments:    UPPER EXTREMITY MMT:  MMT Right eval Left eval  Shoulder flexion 4 5  Shoulder extension    Shoulder abduction 4 5  Shoulder extension    Shoulder internal rotation 5 5  Shoulder external rotation 4 5  Elbow flexion 5 5  Elbow extension 5 5  Grip strength    (Blank rows = not tested)  (Key: WFL = within functional limits not formally assessed, * = concordant pain, s = stiffness/stretching sensation, NT = not tested)  Comments: MMT with some discomfort but no overt pain per pt report  SHOULDER SPECIAL TESTS: Positive neer's on RUE  JOINT MOBILITY TESTING:  NT  PALPATION:  Concordant tenderness just inferior to coracoid process on RUE - otherwise palpation exam unremarkable for concordant pain, some muscular tightness as expected throughout periscapular/RC musculature                                                                                                                             TREATMENT DATE:  [facility] Adult PT Treatment:  DATE: 01/08/24 Therapeutic Exercise: Shoulder flexion isometrics x5, 5sec hold cues for alignment and appropriate force output Shoulder ER isometrics x5, 5 sec hold cues for alignment HEP handout + education, time spent w/ education on rationale for interventions and relevant anatomy/physiology      PATIENT EDUCATION: Education details: Pt education on PT impairments, prognosis, and POC. Informed consent. Rationale for interventions, safe/appropriate HEP performance, activity modification, sleep positioning Person educated: Patient Education method: Explanation, Demonstration, Tactile cues, Verbal cues Education comprehension: verbalized understanding, returned demonstration, verbal cues required, tactile cues required, and needs further education    HOME EXERCISE PROGRAM: Access Code: [email or web address] URL: [email or web address] Date: 01/08/2024 Prepared by: Fransisco Hertz  Exercises - Isometric Shoulder Flexion at Wall  - 2-3 x daily - 1 sets - 5-8 reps - 5sec hold - Standing Isometric Shoulder External Rotation with Doorway  - 2-3 x daily - 1 sets - 5-8 reps - 5sec hold  ASSESSMENT:  CLINICAL IMPRESSION: Patient is a pleasant 83 y.o. gentleman who was seen today for physical therapy evaluation and treatment for R shoulder pain ongoing since changing smoke alarm in September (strenuous overhead movement). States symptoms are fairly consistent since, non-limiting but does bother him with sleeping, lifting overhead, and reaching back/laterally. On exam he demonstrates mild reduction in R GH strength, postural deficits, and mild limitations in [facility] mobility. Good functional GH rotational ROM compared to contralateral side but does require increased time/effort. Tolerates HEP/exam well overall without adverse event. Initial emphasis on shoulder stability, encouraged modest activity modification (temporarily limiting his self assessment of concordant movement - combined shoulder extension/ER/elevation) and discussed strategies to improve sleep positioning. Recommend trial of skilled PT to address aforementioned deficits with aim of improving functional tolerance and reducing pain with typical activities. Pt departs today's session in no acute distress, all voiced concerns/questions  addressed appropriately from PT perspective.     OBJECTIVE IMPAIRMENTS: decreased activity tolerance, decreased mobility, decreased ROM, decreased strength, impaired UE functional use, postural dysfunction, and pain.   ACTIVITY LIMITATIONS: carrying, lifting, sleeping, and reach over head  PARTICIPATION LIMITATIONS: meal prep, cleaning, laundry, community activity, and yard work  PERSONAL FACTORS: Age, Time since onset of injury/illness/exacerbation, and 1-2 comorbidities: cataracts, hx cancer  are also affecting patient's functional outcome.   REHAB POTENTIAL: Good  CLINICAL DECISION MAKING: Stable/uncomplicated  EVALUATION COMPLEXITY: Low   GOALS:  SHORT TERM GOALS: Target date: 02/05/2024 Pt will demonstrate appropriate understanding and performance of initially prescribed HEP in order to facilitate improved independence with management of symptoms.  Baseline: HEP provided on eval Goal status: INITIAL   2. Pt will report at least 25% decrease in overall pain levels in past week in order to facilitate improved tolerance to basic ADLs/mobility.   Baseline: 0-9/10  Goal status: INITIAL    LONG TERM GOALS: Target date: 03/04/2024 Pt will score 68 on FOTO in order to demonstrate improved perception of function due to symptoms. Baseline: 63 Goal status: INITIAL  2.  Pt will demonstrate grossly symmetrical active shoulder elevation in order to demonstrate improved tolerance to functional movement patterns such as reaching overhead.  Baseline: see ROM chart above Goal status: INITIAL  3.  Pt will demonstrate grossly symmetrical shoulder MMT for improved symmetry of UE strength and improved tolerance to functional movements.  Baseline: see MMT chart above Goal status: INITIAL  4. Pt will report/demonstrate ability lift up to 8# overhead with less than 2 point increase in pain on NPS in order to indicate improved tolerance/independence with functional tasks  such as picking up items  from cabinets in pantry.   Baseline: pain/weakness with lifting items overhead  Goal status: INITIAL   5. Pt will report at least 50% decrease in overall pain levels in past week in order to facilitate improved tolerance to basic ADLs/mobility.   Baseline: 0-9/10  Goal status: INITIAL    PLAN:  PT FREQUENCY: 1-2x/week  PT DURATION: 8 weeks  PLANNED INTERVENTIONS: 97164- PT Re-evaluation, 97110-Therapeutic exercises, 97530- Therapeutic activity, 97112- Neuromuscular re-education, 97535- Self Care, 16109- Manual therapy, Patient/Family education, Taping, Dry Needling, Joint mobilization, Spinal mobilization, Cryotherapy, and Moist heat  PLAN FOR NEXT SESSION: Review/update HEP PRN. Work on [employer] exercises as appropriate with emphasis on anterior shoulder/RC stability. Gradual strengthening into elevated positions as able/appropriate pending trajectory. Symptom modification strategies as indicated/appropriate, mindful of cancer history.   Ashley Murrain PT, DPT 01/08/2024 11:36 AM

## 2024-01-08 ENCOUNTER — Encounter: Payer: Self-pay | Admitting: Physical Therapy

## 2024-01-08 ENCOUNTER — Ambulatory Visit: Payer: Medicare Other | Attending: Sports Medicine | Admitting: Physical Therapy

## 2024-01-08 ENCOUNTER — Other Ambulatory Visit: Payer: Self-pay

## 2024-01-08 ENCOUNTER — Encounter: Payer: Self-pay | Admitting: Sports Medicine

## 2024-01-08 DIAGNOSIS — M6281 Muscle weakness (generalized): Secondary | ICD-10-CM | POA: Insufficient documentation

## 2024-01-08 DIAGNOSIS — M7541 Impingement syndrome of right shoulder: Secondary | ICD-10-CM | POA: Diagnosis not present

## 2024-01-08 DIAGNOSIS — M25511 Pain in right shoulder: Secondary | ICD-10-CM | POA: Diagnosis present

## 2024-01-10 ENCOUNTER — Encounter: Payer: Medicare Other | Admitting: Physical Therapy

## 2024-01-14 NOTE — Therapy (Signed)
OUTPATIENT PHYSICAL THERAPY TREATMENT   Patient Name: Brian Donovan MRN: [id number] DOB:[birth year], 83 y.o., male Today's Date: 01/15/2024  END OF SESSION:  PT End of Session - 01/15/24 1143     Visit Number 2    Number of Visits 9    Date for PT Re-Evaluation 03/04/24    Authorization Type [employer] MDC    Authorization Time Period *8 VISITS APPROVED FOR PT 01/08/2024-03/04/2024    Authorization - Visit Number 2    Authorization - Number of Visits 8    PT Start Time 1147    PT Stop Time 1227    PT Time Calculation (min) 40 min    Activity Tolerance Patient tolerated treatment well              Past Medical History:  Diagnosis Date   Cataracts, both eyes    Colon polyps    High cholesterol    History of colon polyps 03/25/2020   History of prostate cancer 03/25/2020   History of prostatectomy 03/25/2020   Mixed hyperlipidemia 03/25/2020   Primary osteoarthritis involving multiple joints 03/25/2020   Prostate cancer [facility])    Past Surgical History:  Procedure Laterality Date   PROSTATECTOMY     Patient Active Problem List   Diagnosis Date Noted   Impingement syndrome, shoulder, right 01/02/2024   Well adult exam 06/26/2023   Restless legs 06/19/2023   Acute maxillary sinusitis 02/15/2023   Urinary incontinence 05/29/2022   Injury of left shoulder 06/14/2021   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   Lumbar spondylosis 04/29/2021   History of prostate cancer 03/25/2020   History of prostatectomy 03/25/2020   History of colon polyps 03/25/2020   Mixed hyperlipidemia 03/25/2020   Primary osteoarthritis involving multiple joints 03/25/2020    PCP: Everrett Coombe, DO  REFERRING PROVIDER: Monica Becton, MD  REFERRING DIAG: M75.41 (ICD-10-CM) - Impingement syndrome, shoulder, right  THERAPY DIAG:  Right shoulder pain, unspecified chronicity  Muscle weakness (generalized)  Rationale for Evaluation and Treatment: Rehabilitation  ONSET  DATE: End of September  SUBJECTIVE:                                                                                                                                                                                     Per eval - Pt states in end of September 2024, he was changing a smoke detector overhead, was reaching with his R arm. Didn't bother him in the moment but pain started the next day. Felt it was going to self resolve but so far hasn't changed much for better or worse. Bothers him at night, wakes occasionally when he rolls onto it. Tends to do  okay during the day when he's less active. Denies overt limitations due to his shoulder but does feel it is weaker. Notices particular difficulty with lifting objects overhead. He describes his most concordant pain with a combination of shoulder extension/elevation/ER (back end of pitching position)  No N/T but does have some chronic hand stiffness he feels is arthritic, unchanged.  Hand dominance: Right  SUBJECTIVE STATEMENT: 01/15/2024 not feeling much different since eval, has been doing HEP twice a day or more.   PERTINENT HISTORY: cataracts, hx prostate cancer w/ prostatectomy, urinary incontinence  PAIN:  Are you having pain: no pain  Per eval -  Location/description: deep in R shoulder, anterior Best-worst over past week: up to 9/10, settles quickly  - aggravating factors: reaching backwards, reaching overhead and external rotating  - Easing factors: getting out of provocative position    PRECAUTIONS: cancer hx  WEIGHT BEARING RESTRICTIONS: No  FALLS:  Has patient fallen in last 6 months? No  LIVING ENVIRONMENT: 1 story house, 5-6 STE Lives w/ spouse  OCCUPATION: Retired - worked for [employer]  PLOF: Independent  PATIENT GOALS: wants to try to maximize shoulder as well as he can  NEXT MD VISIT: end of february  OBJECTIVE:  Note: Objective measures were completed at Evaluation unless otherwise noted.  DIAGNOSTIC FINDINGS:   XR R shoulder 01/02/24, no radiology read yet in EPIC at time of eval 01/08/24 read: "IMPRESSION: Mild to moderate degenerative changes of the acromioclavicular and glenohumeral joints."  PATIENT SURVEYS:  FOTO 63 > 68  COGNITION: Overall cognitive status: Within functional limits for tasks assessed     SENSATION: Denies sensory complaints  POSTURE: Mildly rounded shoulders, inc kyphosis, and forward head. Able to correct  UPPER EXTREMITY ROM:  A/PROM Right eval Left eval  Shoulder flexion 130 deg 138 deg  Shoulder abduction 128 deg 140 deg  Shoulder internal rotation (functional combination) T11 (inc time/effort) T11  Shoulder external rotation (functional combination) C5 C7  Elbow flexion    Elbow extension    Wrist flexion    Wrist extension     (Blank rows = not tested) (Key: WFL = within functional limits not formally assessed, * = concordant pain, s = stiffness/stretching sensation, NT = not tested)  Comments:    UPPER EXTREMITY MMT:  MMT Right eval Left eval  Shoulder flexion 4 5  Shoulder extension    Shoulder abduction 4 5  Shoulder extension    Shoulder internal rotation 5 5  Shoulder external rotation 4 5  Elbow flexion 5 5  Elbow extension 5 5  Grip strength    (Blank rows = not tested)  (Key: WFL = within functional limits not formally assessed, * = concordant pain, s = stiffness/stretching sensation, NT = not tested)  Comments: MMT with some discomfort but no overt pain per pt report  SHOULDER SPECIAL TESTS: Positive neer's on RUE  JOINT MOBILITY TESTING:  NT  PALPATION:  Concordant tenderness just inferior to coracoid process on RUE - otherwise palpation exam unremarkable for concordant pain, some muscular tightness as expected throughout periscapular/RC musculature  TREATMENT DATE:  [facility] Adult PT Treatment:                                                 DATE: 01/15/24 Therapeutic Exercise: Supine manual perturbations, multidirectional 3x30sec beginning near shoulder joint gradually working distally to elbow Supine shoulder AROM to pt tolerance, RUE 2x8  Green band double ER 3x10 cues for shoulder/elbow positioning Green band bicep curl 2x8 cues for appropriate tension Standing tricep pull down, green band 2x8  Green band row 2x8 cues for elbow/hand positioning and scap squeeze HEP update + education/handout    [facility] Adult PT Treatment:                                                DATE: 01/08/24 Therapeutic Exercise: Shoulder flexion isometrics x5, 5sec hold cues for alignment and appropriate force output Shoulder ER isometrics x5, 5 sec hold cues for alignment HEP handout + education, time spent w/ education on rationale for interventions and relevant anatomy/physiology     PATIENT EDUCATION: Education details: rationale for interventions, HEP  Person educated: Patient Education method: Explanation, Demonstration, Tactile cues, Verbal cues Education comprehension: verbalized understanding, returned demonstration, verbal cues required, tactile cues required, and needs further education     HOME EXERCISE PROGRAM: Access Code: [email or web address] URL: [email or web address] Date: 01/15/2024 Prepared by: Fransisco Hertz  Exercises - Standing Single Arm Elbow Flexion with Resistance  - 1 x daily - 2-3 sets - 8-10 reps - Standing Tricep Extensions with Resistance  - 1 x daily - 2-3 sets - 8-10 reps - Standing Shoulder Row with Anchored Resistance  - 1 x daily - 2-3 sets - 8-10 reps  ASSESSMENT:  CLINICAL IMPRESSION: 01/15/2024 Pt arrives w/o pain, no changes since initial eval. Today working on progressing upper quarter strength, introducing manual perturbations in GH elevated position for end range endurance. Tolerates well without any increases in resting pain, no adverse events. HEP updates as  above. Recommend continuing along current POC in order to address relevant deficits and improve functional tolerance. Pt departs today's session in no acute distress, all voiced questions/concerns addressed appropriately from PT perspective.    Per eval - Patient is a pleasant 83 y.o. gentleman who was seen today for physical therapy evaluation and treatment for R shoulder pain ongoing since changing smoke alarm in September (strenuous overhead movement). States symptoms are fairly consistent since, non-limiting but does bother him with sleeping, lifting overhead, and reaching back/laterally. On exam he demonstrates mild reduction in R GH strength, postural deficits, and mild limitations in [facility] mobility. Good functional GH rotational ROM compared to contralateral side but does require increased time/effort. Tolerates HEP/exam well overall without adverse event. Initial emphasis on shoulder stability, encouraged modest activity modification (temporarily limiting his self assessment of concordant movement - combined shoulder extension/ER/elevation) and discussed strategies to improve sleep positioning. Recommend trial of skilled PT to address aforementioned deficits with aim of improving functional tolerance and reducing pain with typical activities. Pt departs today's session in no acute distress, all voiced concerns/questions addressed appropriately from PT perspective.     OBJECTIVE IMPAIRMENTS: decreased activity tolerance, decreased mobility, decreased ROM, decreased strength, impaired UE functional use, postural dysfunction, and pain.   ACTIVITY LIMITATIONS: carrying,  lifting, sleeping, and reach over head  PARTICIPATION LIMITATIONS: meal prep, cleaning, laundry, community activity, and yard work  PERSONAL FACTORS: Age, Time since onset of injury/illness/exacerbation, and 1-2 comorbidities: cataracts, hx cancer  are also affecting patient's functional outcome.   REHAB POTENTIAL: Good  CLINICAL  DECISION MAKING: Stable/uncomplicated  EVALUATION COMPLEXITY: Low   GOALS:  SHORT TERM GOALS: Target date: 02/05/2024 Pt will demonstrate appropriate understanding and performance of initially prescribed HEP in order to facilitate improved independence with management of symptoms.  Baseline: HEP provided on eval Goal status: INITIAL   2. Pt will report at least 25% decrease in overall pain levels in past week in order to facilitate improved tolerance to basic ADLs/mobility.   Baseline: 0-9/10  Goal status: INITIAL    LONG TERM GOALS: Target date: 03/04/2024 Pt will score 68 on FOTO in order to demonstrate improved perception of function due to symptoms. Baseline: 63 Goal status: INITIAL  2.  Pt will demonstrate grossly symmetrical active shoulder elevation in order to demonstrate improved tolerance to functional movement patterns such as reaching overhead.  Baseline: see ROM chart above Goal status: INITIAL  3.  Pt will demonstrate grossly symmetrical shoulder MMT for improved symmetry of UE strength and improved tolerance to functional movements.  Baseline: see MMT chart above Goal status: INITIAL  4. Pt will report/demonstrate ability lift up to 8# overhead with less than 2 point increase in pain on NPS in order to indicate improved tolerance/independence with functional tasks such as picking up items from cabinets in pantry.   Baseline: pain/weakness with lifting items overhead  Goal status: INITIAL   5. Pt will report at least 50% decrease in overall pain levels in past week in order to facilitate improved tolerance to basic ADLs/mobility.   Baseline: 0-9/10  Goal status: INITIAL    PLAN:  PT FREQUENCY: 1-2x/week  PT DURATION: 8 weeks  PLANNED INTERVENTIONS: 97164- PT Re-evaluation, 97110-Therapeutic exercises, 97530- Therapeutic activity, 97112- Neuromuscular re-education, 97535- Self Care, 16109- Manual therapy, Patient/Family education, Taping, Dry Needling, Joint  mobilization, Spinal mobilization, Cryotherapy, and Moist heat  PLAN FOR NEXT SESSION: Review/update HEP PRN. Work on [employer] exercises as appropriate with emphasis on anterior shoulder/RC stability. Gradual strengthening into elevated positions as able/appropriate pending trajectory. Symptom modification strategies as indicated/appropriate, mindful of cancer history.   Ashley Murrain PT, DPT 01/15/2024 12:34 PM

## 2024-01-15 ENCOUNTER — Encounter: Payer: Self-pay | Admitting: Physical Therapy

## 2024-01-15 ENCOUNTER — Ambulatory Visit: Payer: Medicare Other | Admitting: Physical Therapy

## 2024-01-15 DIAGNOSIS — M6281 Muscle weakness (generalized): Secondary | ICD-10-CM

## 2024-01-15 DIAGNOSIS — M25511 Pain in right shoulder: Secondary | ICD-10-CM | POA: Diagnosis not present

## 2024-01-17 ENCOUNTER — Encounter: Payer: Medicare Other | Admitting: Physical Therapy

## 2024-01-21 ENCOUNTER — Ambulatory Visit (INDEPENDENT_AMBULATORY_CARE_PROVIDER_SITE_OTHER): Payer: Medicare Other | Admitting: Medical-Surgical

## 2024-01-21 DIAGNOSIS — Z23 Encounter for immunization: Secondary | ICD-10-CM

## 2024-01-21 NOTE — Therapy (Signed)
 OUTPATIENT PHYSICAL THERAPY TREATMENT   Patient Name: Brian Donovan MRN: [id number] DOB:1941/09/02, 83 y.o., male Today's Date: 01/22/2024  END OF SESSION:  PT End of Session - 01/22/24 1102     Visit Number 3    Number of Visits 9    Date for PT Re-Evaluation 03/04/24    Authorization Type [employer] M[employer]    Authorization Time Period *8 VISITS APPROVED FOR PT 01/08/2024-03/04/2024    Authorization - Visit Number 3    Authorization - Number of Visits 8    PT Start Time 1102    PT Stop Time 1145    PT Time Calculation (min) 43 min    Activity Tolerance Patient tolerated treatment well               Past Medical History:  Diagnosis Date   Cataracts, both eyes    Colon polyps    High cholesterol    History of colon polyps 03/25/2020   History of prostate cancer 03/25/2020   History of prostatectomy 03/25/2020   Mixed hyperlipidemia 03/25/2020   Primary osteoarthritis involving multiple joints 03/25/2020   Prostate cancer [facility])    Past Surgical History:  Procedure Laterality Date   PROSTATECTOMY     Patient Active Problem List   Diagnosis Date Noted   Impingement syndrome, shoulder, right 01/02/2024   Well adult exam 06/26/2023   Restless legs 06/19/2023   Acute maxillary sinusitis 02/15/2023   Urinary incontinence 05/29/2022   Injury of left shoulder 06/14/2021   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   Lumbar spondylosis 04/29/2021   History of prostate cancer 03/25/2020   History of prostatectomy 03/25/2020   History of colon polyps 03/25/2020   Mixed hyperlipidemia 03/25/2020   Primary osteoarthritis involving multiple joints 03/25/2020    PCP: Alvia Bring, DO  REFERRING PROVIDER: Curtis Debby PARAS, MD  REFERRING DIAG: M75.41 (ICD-10-CM) - Impingement syndrome, shoulder, right  THERAPY DIAG:  Right shoulder pain, unspecified chronicity  Muscle weakness (generalized)  Rationale for Evaluation and Treatment: Rehabilitation  ONSET  DATE: End of September  SUBJECTIVE:                                                                                                                                                                                     Per eval - Pt states in end of September 2024, he was changing a smoke detector overhead, was reaching with his R arm. Didn't bother him in the moment but pain started the next day. Felt it was going to self resolve but so far hasn't changed much for better or worse. Bothers him at night, wakes occasionally when he rolls onto it. Tends to  do okay during the day when he's less active. Denies overt limitations due to his shoulder but does feel it is weaker. Notices particular difficulty with lifting objects overhead. He describes his most concordant pain with a combination of shoulder extension/elevation/ER (back end of pitching position)  No N/T but does have some chronic hand stiffness he feels is arthritic, unchanged.  Hand dominance: Right  SUBJECTIVE STATEMENT: 01/22/2024 doesn't endorse any significant changes. States it has still woken him up when rolling onto the arm.    PERTINENT HISTORY: cataracts, hx prostate cancer w/ prostatectomy, urinary incontinence  PAIN:  Are you having pain: no pain  Per eval -  Location/description: deep in R shoulder, anterior Best-worst over past week: up to 9/10, settles quickly  - aggravating factors: reaching backwards, reaching overhead and external rotating  - Easing factors: getting out of provocative position    PRECAUTIONS: cancer hx  WEIGHT BEARING RESTRICTIONS: No  FALLS:  Has patient fallen in last 6 months? No  LIVING ENVIRONMENT: 1 story house, 5-6 STE Lives w/ spouse  OCCUPATION: Retired - worked for [employer]  PLOF: Independent  PATIENT GOALS: wants to try to maximize shoulder as well as he can  NEXT MD VISIT: end of february  OBJECTIVE:  Note: Objective measures were completed at Evaluation unless otherwise  noted.  DIAGNOSTIC FINDINGS:  XR R shoulder 01/02/24, no radiology read yet in EPIC at time of eval 01/08/24 read: IMPRESSION: Mild to moderate degenerative changes of the acromioclavicular and glenohumeral joints.  PATIENT SURVEYS:  FOTO 63 > 68  COGNITION: Overall cognitive status: Within functional limits for tasks assessed     SENSATION: Denies sensory complaints  POSTURE: Mildly rounded shoulders, inc kyphosis, and forward head. Able to correct  UPPER EXTREMITY ROM:  A/PROM Right eval Left eval  Shoulder flexion 130 deg 138 deg  Shoulder abduction 128 deg 140 deg  Shoulder internal rotation (functional combination) T11 (inc time/effort) T11  Shoulder external rotation (functional combination) C5 C7  Elbow flexion    Elbow extension    Wrist flexion    Wrist extension     (Blank rows = not tested) (Key: WFL = within functional limits not formally assessed, * = concordant pain, s = stiffness/stretching sensation, NT = not tested)  Comments:    UPPER EXTREMITY MMT:  MMT Right eval Left eval  Shoulder flexion 4 5  Shoulder extension    Shoulder abduction 4 5  Shoulder extension    Shoulder internal rotation 5 5  Shoulder external rotation 4 5  Elbow flexion 5 5  Elbow extension 5 5  Grip strength    (Blank rows = not tested)  (Key: WFL = within functional limits not formally assessed, * = concordant pain, s = stiffness/stretching sensation, NT = not tested)  Comments: MMT with some discomfort but no overt pain per pt report  SHOULDER SPECIAL TESTS: Positive neer's on RUE  JOINT MOBILITY TESTING:  NT  PALPATION:  Concordant tenderness just inferior to coracoid process on RUE - otherwise palpation exam unremarkable for concordant pain, some muscular tightness as expected throughout periscapular/RC musculature  TREATMENT DATE:   [facility] Adult PT Treatment:                                                DATE: 01/22/24 Therapeutic Exercise: Supine chest press 3# 2x12 RUE (cues for elbow flare, pain w/ increased abduction position) Sidelying shoulder abduction AROM 2x8 cues for shoulder positioning Blue band tricep pulldown 2x10 Blue band bicep curl 2x10 HEP update + education, time spent w/ education on rationale + relevant anatomy/physiology   Therapeutic Activity: Supine shoulder flexion 2# 3x5 RUE for improving strength w/ functional reaching Modified counter plank + UE reaches alternating, 2x5 for WB tolerance and functional reaching Also discussed sleep positioning, pillow use for improved comfort    [facility] Adult PT Treatment:                                                DATE: 01/15/24 Therapeutic Exercise: Supine manual perturbations, multidirectional 3x30sec beginning near shoulder joint gradually working distally to elbow Supine shoulder AROM to pt tolerance, RUE 2x8  Green band double ER 3x10 cues for shoulder/elbow positioning Green band bicep curl 2x8 cues for appropriate tension Standing tricep pull down, green band 2x8  Green band row 2x8 cues for elbow/hand positioning and scap squeeze HEP update + education/handout    [facility] Adult PT Treatment:                                                DATE: 01/08/24 Therapeutic Exercise: Shoulder flexion isometrics x5, 5sec hold cues for alignment and appropriate force output Shoulder ER isometrics x5, 5 sec hold cues for alignment HEP handout + education, time spent w/ education on rationale for interventions and relevant anatomy/physiology     PATIENT EDUCATION: Education details: rationale for interventions, HEP  Person educated: Patient Education method: Explanation, Demonstration, Tactile cues, Verbal cues Education comprehension: verbalized understanding, returned demonstration, verbal cues required, tactile cues required, and needs further education      HOME EXERCISE PROGRAM: Access Code: [email or web address] URL: [email or web address] Date: 01/22/2024 Prepared by: Alm Jenny  Exercises - Standing Single Arm Elbow Flexion with Resistance  - 1 x daily - 2-3 sets - 8-10 reps - Standing Tricep Extensions with Resistance  - 1 x daily - 2-3 sets - 8-10 reps - Standing Shoulder Row with Anchored Resistance  - 1 x daily - 2-3 sets - 8-10 reps - Supine Shoulder Flexion Extension Full Range AROM  - 2-3 x daily - 1 sets - 8 reps - Sidelying Shoulder Abduction Palm Forward  - 2-3 x daily - 1 sets - 8 reps  ASSESSMENT:  CLINICAL IMPRESSION: 01/22/2024 Pt arrives w/o pain, no significant changes since last session. Today progressing upper quarter strength program with longer lever arms in gravity reduced position, addition of pushing movements. No adverse events, tolerates well overall. Increased time w/ rationale for interventions and relevant anatomy/physiology to increase pt self efficacy with management of program.    Per eval - Patient is a pleasant 83 y.o. gentleman who was seen today for physical therapy evaluation and treatment for R shoulder pain ongoing  since changing smoke alarm in September (strenuous overhead movement). States symptoms are fairly consistent since, non-limiting but does bother him with sleeping, lifting overhead, and reaching back/laterally. On exam he demonstrates mild reduction in R GH strength, postural deficits, and mild limitations in [facility] mobility. Good functional GH rotational ROM compared to contralateral side but does require increased time/effort. Tolerates HEP/exam well overall without adverse event. Initial emphasis on shoulder stability, encouraged modest activity modification (temporarily limiting his self assessment of concordant movement - combined shoulder extension/ER/elevation) and discussed strategies to improve sleep positioning. Recommend trial of skilled PT to address aforementioned deficits with aim  of improving functional tolerance and reducing pain with typical activities. Pt departs today's session in no acute distress, all voiced concerns/questions addressed appropriately from PT perspective.     OBJECTIVE IMPAIRMENTS: decreased activity tolerance, decreased mobility, decreased ROM, decreased strength, impaired UE functional use, postural dysfunction, and pain.   ACTIVITY LIMITATIONS: carrying, lifting, sleeping, and reach over head  PARTICIPATION LIMITATIONS: meal prep, cleaning, laundry, community activity, and yard work  PERSONAL FACTORS: Age, Time since onset of injury/illness/exacerbation, and 1-2 comorbidities: cataracts, hx cancer  are also affecting patient's functional outcome.   REHAB POTENTIAL: Good  CLINICAL DECISION MAKING: Stable/uncomplicated  EVALUATION COMPLEXITY: Low   GOALS:  SHORT TERM GOALS: Target date: 02/05/2024 Pt will demonstrate appropriate understanding and performance of initially prescribed HEP in order to facilitate improved independence with management of symptoms.  Baseline: HEP provided on eval Goal status: INITIAL   2. Pt will report at least 25% decrease in overall pain levels in past week in order to facilitate improved tolerance to basic ADLs/mobility.   Baseline: 0-9/10  Goal status: INITIAL    LONG TERM GOALS: Target date: 03/04/2024 Pt will score 68 on FOTO in order to demonstrate improved perception of function due to symptoms. Baseline: 63 Goal status: INITIAL  2.  Pt will demonstrate grossly symmetrical active shoulder elevation in order to demonstrate improved tolerance to functional movement patterns such as reaching overhead.  Baseline: see ROM chart above Goal status: INITIAL  3.  Pt will demonstrate grossly symmetrical shoulder MMT for improved symmetry of UE strength and improved tolerance to functional movements.  Baseline: see MMT chart above Goal status: INITIAL  4. Pt will report/demonstrate ability lift up to 8#  overhead with less than 2 point increase in pain on NPS in order to indicate improved tolerance/independence with functional tasks such as picking up items from cabinets in pantry.   Baseline: pain/weakness with lifting items overhead  Goal status: INITIAL   5. Pt will report at least 50% decrease in overall pain levels in past week in order to facilitate improved tolerance to basic ADLs/mobility.   Baseline: 0-9/10  Goal status: INITIAL    PLAN:  PT FREQUENCY: 1-2x/week  PT DURATION: 8 weeks  PLANNED INTERVENTIONS: 97164- PT Re-evaluation, 97110-Therapeutic exercises, 97530- Therapeutic activity, 97112- Neuromuscular re-education, 97535- Self Care, [id number]- Manual therapy, Patient/Family education, Taping, Dry Needling, Joint mobilization, Spinal mobilization, Cryotherapy, and Moist heat  PLAN FOR NEXT SESSION: Review/update HEP PRN. Work on [employer] exercises as appropriate with emphasis on anterior shoulder/RC stability. Gradual strengthening into elevated positions as able/appropriate pending trajectory. Symptom modification strategies as indicated/appropriate, mindful of cancer history.   Alm DELENA Jenny PT, DPT 01/22/2024 11:47 AM

## 2024-01-22 ENCOUNTER — Encounter: Payer: Self-pay | Admitting: Physical Therapy

## 2024-01-22 ENCOUNTER — Ambulatory Visit: Payer: Medicare Other | Attending: Sports Medicine | Admitting: Physical Therapy

## 2024-01-22 DIAGNOSIS — M6281 Muscle weakness (generalized): Secondary | ICD-10-CM | POA: Insufficient documentation

## 2024-01-22 DIAGNOSIS — M25511 Pain in right shoulder: Secondary | ICD-10-CM | POA: Diagnosis present

## 2024-01-29 ENCOUNTER — Ambulatory Visit: Payer: Medicare Other | Admitting: Physical Therapy

## 2024-02-04 NOTE — Therapy (Signed)
 OUTPATIENT PHYSICAL THERAPY TREATMENT   Patient Name: Brian Donovan MRN: [id number] DOB:11-21-1941, 83 y.o., male Today's Date: 02/05/2024  END OF SESSION:  PT End of Session - 02/05/24 1101     Visit Number 4    Number of Visits 9    Date for PT Re-Evaluation 03/04/24    Authorization Type [employer] MDC    Authorization Time Period *8 VISITS APPROVED FOR PT 01/08/2024-03/04/2024    Authorization - Visit Number 4    Authorization - Number of Visits 8    PT Start Time 1101    PT Stop Time 1145    PT Time Calculation (min) 44 min    Activity Tolerance Patient tolerated treatment well                Past Medical History:  Diagnosis Date   Cataracts, both eyes    Colon polyps    High cholesterol    History of colon polyps 03/25/2020   History of prostate cancer 03/25/2020   History of prostatectomy 03/25/2020   Mixed hyperlipidemia 03/25/2020   Primary osteoarthritis involving multiple joints 03/25/2020   Prostate cancer [facility])    Past Surgical History:  Procedure Laterality Date   PROSTATECTOMY     Patient Active Problem List   Diagnosis Date Noted   Impingement syndrome, shoulder, right 01/02/2024   Well adult exam 06/26/2023   Restless legs 06/19/2023   Acute maxillary sinusitis 02/15/2023   Urinary incontinence 05/29/2022   Injury of left shoulder 06/14/2021   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   Lumbar spondylosis 04/29/2021   History of prostate cancer 03/25/2020   History of prostatectomy 03/25/2020   History of colon polyps 03/25/2020   Mixed hyperlipidemia 03/25/2020   Primary osteoarthritis involving multiple joints 03/25/2020    PCP: Everrett Coombe, DO  REFERRING PROVIDER: Monica Becton, MD  REFERRING DIAG: M75.41 (ICD-10-CM) - Impingement syndrome, shoulder, right  THERAPY DIAG:  Right shoulder pain, unspecified chronicity  Muscle weakness (generalized)  Rationale for Evaluation and Treatment:  Rehabilitation  ONSET DATE: End of September  SUBJECTIVE:                                                                                                                                                                                     Per eval - Pt states in end of September 2024, he was changing a smoke detector overhead, was reaching with his R arm. Didn't bother him in the moment but pain started the next day. Felt it was going to self resolve but so far hasn't changed much for better or worse. Bothers him at night, wakes occasionally when he rolls onto it. Tends  to do okay during the day when he's less active. Denies overt limitations due to his shoulder but does feel it is weaker. Notices particular difficulty with lifting objects overhead. He describes his most concordant pain with a combination of shoulder extension/elevation/ER (back end of pitching position)  No N/T but does have some chronic hand stiffness he feels is arthritic, unchanged.  Hand dominance: Right  SUBJECTIVE STATEMENT: 02/05/2024 Some tightness in shoulder, no pain at present. States him and his wife were under the weather, had some body aches but no fever. He mentions some R hip pain with reducing activity levels.     PERTINENT HISTORY: cataracts, hx prostate cancer w/ prostatectomy, urinary incontinence  PAIN:  Are you having pain: no pain  Per eval -  Location/description: deep in R shoulder, anterior Best-worst over past week: up to 9/10, settles quickly  - aggravating factors: reaching backwards, reaching overhead and external rotating  - Easing factors: getting out of provocative position    PRECAUTIONS: cancer hx  WEIGHT BEARING RESTRICTIONS: No  FALLS:  Has patient fallen in last 6 months? No  LIVING ENVIRONMENT: 1 story house, 5-6 STE Lives w/ spouse  OCCUPATION: Retired - worked for [employer]  PLOF: Independent  PATIENT GOALS: wants to try to maximize shoulder as well as he can  NEXT MD  VISIT: end of february  OBJECTIVE:  Note: Objective measures were completed at Evaluation unless otherwise noted.  DIAGNOSTIC FINDINGS:  XR R shoulder 01/02/24, no radiology read yet in EPIC at time of eval 01/08/24 read: "IMPRESSION: Mild to moderate degenerative changes of the acromioclavicular and glenohumeral joints."  PATIENT SURVEYS:  FOTO 63 > 68  COGNITION: Overall cognitive status: Within functional limits for tasks assessed     SENSATION: Denies sensory complaints  POSTURE: Mildly rounded shoulders, inc kyphosis, and forward head. Able to correct  UPPER EXTREMITY ROM:  A/PROM Right eval Left eval  Shoulder flexion 130 deg 138 deg  Shoulder abduction 128 deg 140 deg  Shoulder internal rotation (functional combination) T11 (inc time/effort) T11  Shoulder external rotation (functional combination) C5 C7  Elbow flexion    Elbow extension    Wrist flexion    Wrist extension     (Blank rows = not tested) (Key: WFL = within functional limits not formally assessed, * = concordant pain, s = stiffness/stretching sensation, NT = not tested)  Comments:    UPPER EXTREMITY MMT:  MMT Right eval Left eval  Shoulder flexion 4 5  Shoulder extension    Shoulder abduction 4 5  Shoulder extension    Shoulder internal rotation 5 5  Shoulder external rotation 4 5  Elbow flexion 5 5  Elbow extension 5 5  Grip strength    (Blank rows = not tested)  (Key: WFL = within functional limits not formally assessed, * = concordant pain, s = stiffness/stretching sensation, NT = not tested)  Comments: MMT with some discomfort but no overt pain per pt report  SHOULDER SPECIAL TESTS: Positive neer's on RUE  JOINT MOBILITY TESTING:  NT  PALPATION:  Concordant tenderness just inferior to coracoid process on RUE - otherwise palpation exam unremarkable for concordant pain, some muscular tightness as expected throughout periscapular/RC musculature  TREATMENT DATE:  [facility] Adult PT Treatment:                                                DATE: 02/05/24 Therapeutic Exercise: Supine shoulder flexion AAROM w/ dowel 2x12 cues for pacing Supine shoulder ER/IR AROM with towel prop 2x8 Blu eband chest press RUE x8 Elevated green bicep curl x10 HEP handout + education  Therapeutic Activity: Supine shoulder flexion w/ 2# DB x8 Supine unresisted pec fly x8 long lever, x8 short lever  Self Care: Education on symptom behavior, progression w/ PT, provocative movements, sleeping and reinforcing use of pillow for positioning   [facility] Adult PT Treatment:                                                DATE: 01/22/24 Therapeutic Exercise: Supine chest press 3# 2x12 RUE (cues for elbow flare, pain w/ increased abduction position) Sidelying shoulder abduction AROM 2x8 cues for shoulder positioning Blue band tricep pulldown 2x10 Blue band bicep curl 2x10 HEP update + education, time spent w/ education on rationale + relevant anatomy/physiology   Therapeutic Activity: Supine shoulder flexion 2# 3x5 RUE for improving strength w/ functional reaching Modified counter plank + UE reaches alternating, 2x5 for WB tolerance and functional reaching Also discussed sleep positioning, pillow use for improved comfort    [facility] Adult PT Treatment:                                                DATE: 01/15/24 Therapeutic Exercise: Supine manual perturbations, multidirectional 3x30sec beginning near shoulder joint gradually working distally to elbow Supine shoulder AROM to pt tolerance, RUE 2x8  Green band double ER 3x10 cues for shoulder/elbow positioning Green band bicep curl 2x8 cues for appropriate tension Standing tricep pull down, green band 2x8  Green band row 2x8 cues for elbow/hand positioning and scap squeeze HEP update + education/handout      PATIENT  EDUCATION: Education details: rationale for interventions, HEP  Person educated: Patient Education method: Programmer, multimedia, Facilities manager, Actor cues, Verbal cues Education comprehension: verbalized understanding, returned demonstration, verbal cues required, tactile cues required, and needs further education     HOME EXERCISE PROGRAM: Access Code: [email or web address] URL: [email or web address] Date: 02/05/2024 Prepared by: Fransisco Hertz  Program Notes - with chest flys, please perform without resistance as done in clinic  Exercises - Standing Single Arm Elbow Flexion with Resistance  - 3-4 x weekly - 2-3 sets - 8-10 reps - Standing Tricep Extensions with Resistance  - 3-4 x weekly - 2-3 sets - 8-10 reps - Standing Shoulder Row with Anchored Resistance  - 3-4 x weekly - 2-3 sets - 8-10 reps - Supine Shoulder Flexion Extension Full Range AROM  - 2-3 x daily - 1 sets - 8 reps - Sidelying Shoulder Abduction Palm Forward  - 2-3 x daily - 1 sets - 8 reps - Supine Chest Flys  - 2-3 x daily - 1 sets - 10 reps - Supine Shoulder External Rotation Stretch  - 2-3 x daily - 1 sets - 10 reps  ASSESSMENT:  CLINICAL IMPRESSION:  02/05/2024 Pt arrives w/o pain, reports reduced activity levels over past week due to illness. Given this, limited progression with strength training today but remains able to progress GH mobility and light strengthening. No adverse events, tolerates well. Recommend continuing along current POC in order to address relevant deficits and improve functional tolerance. Pt departs today's session in no acute distress, all voiced questions/concerns addressed appropriately from PT perspective.    Per eval - Patient is a pleasant 83 y.o. gentleman who was seen today for physical therapy evaluation and treatment for R shoulder pain ongoing since changing smoke alarm in September (strenuous overhead movement). States symptoms are fairly consistent since, non-limiting but does bother him  with sleeping, lifting overhead, and reaching back/laterally. On exam he demonstrates mild reduction in R GH strength, postural deficits, and mild limitations in [facility] mobility. Good functional GH rotational ROM compared to contralateral side but does require increased time/effort. Tolerates HEP/exam well overall without adverse event. Initial emphasis on shoulder stability, encouraged modest activity modification (temporarily limiting his self assessment of concordant movement - combined shoulder extension/ER/elevation) and discussed strategies to improve sleep positioning. Recommend trial of skilled PT to address aforementioned deficits with aim of improving functional tolerance and reducing pain with typical activities. Pt departs today's session in no acute distress, all voiced concerns/questions addressed appropriately from PT perspective.     OBJECTIVE IMPAIRMENTS: decreased activity tolerance, decreased mobility, decreased ROM, decreased strength, impaired UE functional use, postural dysfunction, and pain.   ACTIVITY LIMITATIONS: carrying, lifting, sleeping, and reach over head  PARTICIPATION LIMITATIONS: meal prep, cleaning, laundry, community activity, and yard work  PERSONAL FACTORS: Age, Time since onset of injury/illness/exacerbation, and 1-2 comorbidities: cataracts, hx cancer  are also affecting patient's functional outcome.   REHAB POTENTIAL: Good  CLINICAL DECISION MAKING: Stable/uncomplicated  EVALUATION COMPLEXITY: Low   GOALS:  SHORT TERM GOALS: Target date: 02/05/2024 Pt will demonstrate appropriate understanding and performance of initially prescribed HEP in order to facilitate improved independence with management of symptoms.  Baseline: HEP provided on eval 02/05/24: reports good HEP adherence Goal status: MET  2. Pt will report at least 25% decrease in overall pain levels in past week in order to facilitate improved tolerance to basic ADLs/mobility.   Baseline:  0-9/10 Goal status: ONGOING  LONG TERM GOALS: Target date: 03/04/2024 Pt will score 68 on FOTO in order to demonstrate improved perception of function due to symptoms. Baseline: 63 Goal status: INITIAL  2.  Pt will demonstrate grossly symmetrical active shoulder elevation in order to demonstrate improved tolerance to functional movement patterns such as reaching overhead.  Baseline: see ROM chart above Goal status: INITIAL  3.  Pt will demonstrate grossly symmetrical shoulder MMT for improved symmetry of UE strength and improved tolerance to functional movements.  Baseline: see MMT chart above Goal status: INITIAL  4. Pt will report/demonstrate ability lift up to 8# overhead with less than 2 point increase in pain on NPS in order to indicate improved tolerance/independence with functional tasks such as picking up items from cabinets in pantry.   Baseline: pain/weakness with lifting items overhead  Goal status: INITIAL   5. Pt will report at least 50% decrease in overall pain levels in past week in order to facilitate improved tolerance to basic ADLs/mobility.   Baseline: 0-9/10  Goal status: INITIAL    PLAN:  PT FREQUENCY: 1-2x/week  PT DURATION: 8 weeks  PLANNED INTERVENTIONS: 97164- PT Re-evaluation, 97110-Therapeutic exercises, 97530- Therapeutic activity, [id number]- Neuromuscular re-education, 97535- Self Care, 16109- Manual  therapy, Patient/Family education, Taping, Dry Needling, Joint mobilization, Spinal mobilization, Cryotherapy, and Moist heat  PLAN FOR NEXT SESSION: Review/update HEP PRN. Work on [employer] exercises as appropriate with emphasis on anterior shoulder/RC stability. Gradual strengthening into elevated positions as able/appropriate pending trajectory. Symptom modification strategies as indicated/appropriate, mindful of cancer history.   Ashley Murrain PT, DPT 02/05/2024 11:49 AM

## 2024-02-05 ENCOUNTER — Ambulatory Visit: Payer: Medicare Other | Admitting: Physical Therapy

## 2024-02-05 ENCOUNTER — Encounter: Payer: Self-pay | Admitting: Physical Therapy

## 2024-02-05 DIAGNOSIS — M6281 Muscle weakness (generalized): Secondary | ICD-10-CM

## 2024-02-05 DIAGNOSIS — M25511 Pain in right shoulder: Secondary | ICD-10-CM

## 2024-02-12 ENCOUNTER — Ambulatory Visit: Payer: Medicare Other | Admitting: Physical Therapy

## 2024-02-12 ENCOUNTER — Encounter: Payer: Self-pay | Admitting: Physical Therapy

## 2024-02-12 DIAGNOSIS — M25511 Pain in right shoulder: Secondary | ICD-10-CM | POA: Diagnosis not present

## 2024-02-12 DIAGNOSIS — M6281 Muscle weakness (generalized): Secondary | ICD-10-CM

## 2024-02-12 NOTE — Therapy (Signed)
 OUTPATIENT PHYSICAL THERAPY TREATMENT + DISCHARGE SUMMARY   Patient Name: Brian Donovan MRN: [id number] DOB:1941-05-05, 83 y.o., male Today's Date: 02/12/2024   PHYSICAL THERAPY DISCHARGE SUMMARY  Visits from Start of Care: 5  Current functional level related to goals / functional outcomes: No limitations due to shoulder, some transient pain with heavier activities and specific movements   Remaining deficits: Pain, mild GH weakness   Education / Equipment: HEP, discharge education, follow up with provider, PT goals/POC   Patient agrees to discharge. Patient goals were mostly met. Patient is being discharged due to being pleased with the current functional level.   END OF SESSION:  PT End of Session - 02/12/24 1055     Visit Number 5    Number of Visits 9    Date for PT Re-Evaluation 03/04/24    Authorization Type [employer] M[employer]    Authorization Time Period *8 VISITS APPROVED FOR PT 01/08/2024-03/04/2024    Authorization - Visit Number 5    Authorization - Number of Visits 8    PT Start Time 1056    PT Stop Time 1138    PT Time Calculation (min) 42 min    Activity Tolerance Patient tolerated treatment well              Past Medical History:  Diagnosis Date   Cataracts, both eyes    Colon polyps    High cholesterol    History of colon polyps 03/25/2020   History of prostate cancer 03/25/2020   History of prostatectomy 03/25/2020   Mixed hyperlipidemia 03/25/2020   Primary osteoarthritis involving multiple joints 03/25/2020   Prostate cancer [facility])    Past Surgical History:  Procedure Laterality Date   PROSTATECTOMY     Patient Active Problem List   Diagnosis Date Noted   Impingement syndrome, shoulder, right 01/02/2024   Well adult exam 06/26/2023   Restless legs 06/19/2023   Acute maxillary sinusitis 02/15/2023   Urinary incontinence 05/29/2022   Injury of left shoulder 06/14/2021   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   Lumbar spondylosis  04/29/2021   History of prostate cancer 03/25/2020   History of prostatectomy 03/25/2020   History of colon polyps 03/25/2020   Mixed hyperlipidemia 03/25/2020   Primary osteoarthritis involving multiple joints 03/25/2020    PCP: Everrett Coombe, DO  REFERRING PROVIDER: Monica Becton, MD  REFERRING DIAG: M75.41 (ICD-10-CM) - Impingement syndrome, shoulder, right  THERAPY DIAG:  Right shoulder pain, unspecified chronicity  Muscle weakness (generalized)  Rationale for Evaluation and Treatment: Rehabilitation  ONSET DATE: End of September  SUBJECTIVE:  Per eval - Pt states in end of September 2024, he was changing a smoke detector overhead, was reaching with his R arm. Didn't bother him in the moment but pain started the next day. Felt it was going to self resolve but so far hasn't changed much for better or worse. Bothers him at night, wakes occasionally when he rolls onto it. Tends to do okay during the day when he's less active. Denies overt limitations due to his shoulder but does feel it is weaker. Notices particular difficulty with lifting objects overhead. He describes his most concordant pain with a combination of shoulder extension/elevation/ER (back end of pitching position)  No N/T but does have some chronic hand stiffness he feels is arthritic, unchanged.  Hand dominance: Right  SUBJECTIVE STATEMENT: 02/12/2024 Pt states he is doing much better compared to start of care. Not really limiting him in daily activities, improved tolerance to reaching and pushing up with arm when getting out of bed. Still has some discomfort w/ heavy lifting overhead and reaching laterally/posteriorly but less intense, feels stronger. States he feels ready to discharge today.  Of note, he does arrive w/ antalgic gait  on RLE. Continues to endorse pain/difficulty with his R hip and back, going into groin. Tendency to be worst with sitting and transfers, improves after a few steps.    PERTINENT HISTORY: cataracts, hx prostate cancer w/ prostatectomy, urinary incontinence  PAIN:  Are you having pain: no pain at present, up to 3-4/10 at worst, transient  Per eval -  Location/description: deep in R shoulder, anterior Best-worst over past week: up to 9/10, settles quickly  - aggravating factors: reaching backwards, reaching overhead and external rotating  - Easing factors: getting out of provocative position    PRECAUTIONS: cancer hx  WEIGHT BEARING RESTRICTIONS: No  FALLS:  Has patient fallen in last 6 months? No  LIVING ENVIRONMENT: 1 story house, 5-6 STE Lives w/ spouse  OCCUPATION: Retired - worked for [employer]  PLOF: Independent  PATIENT GOALS: wants to try to maximize shoulder as well as he can  NEXT MD VISIT: end of February  OBJECTIVE:  Note: Objective measures were completed at Evaluation unless otherwise noted.  DIAGNOSTIC FINDINGS:  XR R shoulder 01/02/24, no radiology read yet in EPIC at time of eval 01/08/24 read: "IMPRESSION: Mild to moderate degenerative changes of the acromioclavicular and glenohumeral joints."  PATIENT SURVEYS:  FOTO 63 > 68 FOTO 02/12/24: 63   COGNITION: Overall cognitive status: Within functional limits for tasks assessed     SENSATION: Denies sensory complaints  POSTURE: Mildly rounded shoulders, inc kyphosis, and forward head. Able to correct  UPPER EXTREMITY ROM:  A/PROM Right eval Left eval R/L 02/12/24  Shoulder flexion 130 deg 138 deg 157 deg / 160 deg  Shoulder abduction 128 deg 140 deg 155 deg * / 158 deg  Shoulder internal rotation (functional combination) T11 (inc time/effort) T11   Shoulder external rotation (functional combination) C5 C7   Elbow flexion     Elbow extension     Wrist flexion     Wrist extension      (Blank rows  = not tested) (Key: WFL = within functional limits not formally assessed, * = concordant pain, s = stiffness/stretching sensation, NT = not tested)  Comments:    UPPER EXTREMITY MMT:  MMT Right eval Left eval R/L 02/12/24  Shoulder flexion 4 5 4+/5  Shoulder extension     Shoulder abduction 4 5 4+/5  Shoulder extension  Shoulder internal rotation 5 5   Shoulder external rotation 4 5 4+/5   Elbow flexion 5 5   Elbow extension 5 5   Grip strength     (Blank rows = not tested)  (Key: WFL = within functional limits not formally assessed, * = concordant pain, s = stiffness/stretching sensation, NT = not tested)  Comments: MMT with some discomfort but no overt pain per pt report  SHOULDER SPECIAL TESTS: Positive neer's on RUE  JOINT MOBILITY TESTING:  NT  PALPATION:  Concordant tenderness just inferior to coracoid process on RUE - otherwise palpation exam unremarkable for concordant pain, some muscular tightness as expected throughout periscapular/RC musculature                                                                                                                             TREATMENT DATE:  [facility] Adult PT Treatment:                                                DATE: 02/12/24 Therapeutic Exercise: Supine pec fly, unweighted short lever x10 cues for form and positioning Supine ER AROM w/ shoulder at 90 deg abd x5 cues for setup HEP discussion, education, rationale for interventions, relevant anatomy/physiology, strategies to modify and self progress/regress as needed  Therapeutic Activity: MSK assessment + education FOTO + education Waist<>OH lifting assessment (5# BIL, 8# BIL, 5# RUE only, 8# RUE only) Education/discussion re: progress with PT, symptom behavior as it affects activity tolerance, PT goals/POC, discharge education, follow up with provider     PATIENT EDUCATION: Education details: PT POC, PT goals, progress with PT thus far, discharge planning, HEP,  follow up with provider Person educated: Patient Education method: Explanation, Demonstration, Verbal cues Education comprehension: verbalized understanding, returned demonstration   HOME EXERCISE PROGRAM: Access Code: [email or web address] URL: [email or web address] Date: 02/05/2024 Prepared by: Fransisco Hertz  Program Notes - with chest flys, please perform without resistance as done in clinic  Exercises - Standing Single Arm Elbow Flexion with Resistance  - 3-4 x weekly - 2-3 sets - 8-10 reps - Standing Tricep Extensions with Resistance  - 3-4 x weekly - 2-3 sets - 8-10 reps - Standing Shoulder Row with Anchored Resistance  - 3-4 x weekly - 2-3 sets - 8-10 reps - Supine Shoulder Flexion Extension Full Range AROM  - 2-3 x daily - 1 sets - 8 reps - Sidelying Shoulder Abduction Palm Forward  - 2-3 x daily - 1 sets - 8 reps - Supine Chest Flys  - 2-3 x daily - 1 sets - 10 reps - Supine Shoulder External Rotation Stretch  - 2-3 x daily - 1 sets - 10 reps  ASSESSMENT:  CLINICAL IMPRESSION: 02/12/2024 Pt arrives w/o shoulder pain, states he feels ready to discharge today. No longer endorses limitations due to  shoulder although still has some mild discomfort w/ reaching posteriorly/laterally and heavy lifting overhead. FOTO score unchanged compared to start of care although pt confirms he feels subjective improvement in functional tolerance. He demonstrates notable improvements in shoulder AROM bilaterally, continues to have some mild weakness in R shoulder compared to L but improved compared to start of care. Does well with lifting assessment today as above - able to lift 8# from waist to cabinet w/ RUE with mild transient pain/difficulty. HEP is verbally reviewed with pt and we practice more recent exercises in clinic - time spent w/ education/discussion as above. In discussion with pt, he verbalizes agreement/understanding with plan to discharge to independent HEP at this time. Of note, his R  hip/back continue to bother him and are affecting his gait/transfer mechanics - he states he is seeing referring provider on Friday, encouraged him to continue monitoring these symptoms and communicate with provider accordingly. No adverse events, pt tolerates session well overall. Pt departs today's session in no acute distress, all voiced questions/concerns addressed appropriately from PT perspective.    Per eval - Patient is a pleasant 83 y.o. gentleman who was seen today for physical therapy evaluation and treatment for R shoulder pain ongoing since changing smoke alarm in September (strenuous overhead movement). States symptoms are fairly consistent since, non-limiting but does bother him with sleeping, lifting overhead, and reaching back/laterally. On exam he demonstrates mild reduction in R GH strength, postural deficits, and mild limitations in [facility] mobility. Good functional GH rotational ROM compared to contralateral side but does require increased time/effort. Tolerates HEP/exam well overall without adverse event. Initial emphasis on shoulder stability, encouraged modest activity modification (temporarily limiting his self assessment of concordant movement - combined shoulder extension/ER/elevation) and discussed strategies to improve sleep positioning. Recommend trial of skilled PT to address aforementioned deficits with aim of improving functional tolerance and reducing pain with typical activities. Pt departs today's session in no acute distress, all voiced concerns/questions addressed appropriately from PT perspective.     OBJECTIVE IMPAIRMENTS: decreased strength, postural dysfunction, and pain.   ACTIVITY LIMITATIONS: denies overt limitations  PARTICIPATION LIMITATIONS: denies overt limitations  PERSONAL FACTORS: Age, Time since onset of injury/illness/exacerbation, and 1-2 comorbidities: cataracts, hx cancer  are also affecting patient's functional outcome.   REHAB POTENTIAL:  Good  CLINICAL DECISION MAKING: Stable/uncomplicated  EVALUATION COMPLEXITY: Low   GOALS:  SHORT TERM GOALS: Target date: 02/05/2024 Pt will demonstrate appropriate understanding and performance of initially prescribed HEP in order to facilitate improved independence with management of symptoms.  Baseline: HEP provided on eval 02/05/24: reports good HEP adherence Goal status: MET  2. Pt will report at least 25% decrease in overall pain levels in past week in order to facilitate improved tolerance to basic ADLs/mobility.   Baseline: 0-9/10 02/12/24: no more than 4/10 Goal status: MET  LONG TERM GOALS: Target date: 03/04/2024 Pt will score 68 on FOTO in order to demonstrate improved perception of function due to symptoms. Baseline: 63 02/12/24: 63 Goal status: NOT MET  2.  Pt will demonstrate grossly symmetrical active shoulder elevation in order to demonstrate improved tolerance to functional movement patterns such as reaching overhead.  Baseline: see ROM chart above 02/12/24: see ROM chart Goal status: MET  3.  Pt will demonstrate grossly symmetrical shoulder MMT for improved symmetry of UE strength and improved tolerance to functional movements.  Baseline: see MMT chart above 02/12/24: see MMT chart Goal status: NOT MET ; PROGRESSED  4. Pt will report/demonstrate ability lift  up to 8# overhead with less than 2 point increase in pain on NPS in order to indicate improved tolerance/independence with functional tasks such as picking up items from cabinets in pantry.   Baseline: pain/weakness with lifting items overhead  02/12/24: up to 8# BIL without issue, 5# RUE only without issue, 8# RUE only with mild transient pain/difficulty  Goal status: MET  5. Pt will report at least 50% decrease in overall pain levels in past week in order to facilitate improved tolerance to basic ADLs/mobility.   Baseline: 0-9/10  02/12/24: 0-4/10  Goal status: MET  PLAN: DISCHARGE 02/12/24  PT  FREQUENCY: NA  PT DURATION: NA  PLANNED INTERVENTIONS: NA  PLAN FOR NEXT SESSION: discharge to independent HEP, follow up with provider   Ashley Murrain PT, DPT 02/12/2024 12:02 PM

## 2024-02-13 ENCOUNTER — Ambulatory Visit: Payer: Medicare Other | Admitting: Sports Medicine

## 2024-02-19 ENCOUNTER — Other Ambulatory Visit: Payer: Self-pay | Admitting: Family Medicine

## 2024-02-20 ENCOUNTER — Ambulatory Visit

## 2024-02-20 ENCOUNTER — Ambulatory Visit (INDEPENDENT_AMBULATORY_CARE_PROVIDER_SITE_OTHER): Payer: Medicare Other | Admitting: Sports Medicine

## 2024-02-20 ENCOUNTER — Other Ambulatory Visit: Payer: Self-pay | Admitting: Sports Medicine

## 2024-02-20 ENCOUNTER — Other Ambulatory Visit: Payer: Self-pay

## 2024-02-20 ENCOUNTER — Other Ambulatory Visit (INDEPENDENT_AMBULATORY_CARE_PROVIDER_SITE_OTHER): Payer: Self-pay

## 2024-02-20 DIAGNOSIS — M1611 Unilateral primary osteoarthritis, right hip: Secondary | ICD-10-CM | POA: Diagnosis not present

## 2024-02-20 DIAGNOSIS — M25551 Pain in right hip: Secondary | ICD-10-CM | POA: Diagnosis not present

## 2024-02-20 DIAGNOSIS — M7541 Impingement syndrome of right shoulder: Secondary | ICD-10-CM

## 2024-02-20 DIAGNOSIS — M4722 Other spondylosis with radiculopathy, cervical region: Secondary | ICD-10-CM

## 2024-02-20 MED ORDER — TRIAMCINOLONE ACETONIDE 40 MG/ML IJ SUSP
80.0000 mg | Freq: Once | INTRAMUSCULAR | Status: AC
Start: 2024-02-20 — End: 2024-02-20
  Administered 2024-02-20: 80 mg via INTRAMUSCULAR

## 2024-02-20 NOTE — Assessment & Plan Note (Signed)
 Severe pain right hip joint, we did an injection today, return to see me the in 6 weeks, we Argun to get x-rays and do some home therapy.

## 2024-02-20 NOTE — Assessment & Plan Note (Signed)
 Failed PT, subacromial injection today, return to see me in 6 weeks.

## 2024-02-20 NOTE — Progress Notes (Signed)
    Procedures performed today:    Procedure: Real-time Ultrasound Guided injection of the right hip joint Device: Samsung HS60  Verbal informed consent obtained.  Time-out conducted.  Noted no overlying erythema, induration, or other signs of local infection.  Skin prepped in a sterile fashion.  Local anesthesia: Topical Ethyl chloride.  With sterile technique and under real time ultrasound guidance: Arthritic joint noted, 1 cc Kenalog 40, 2 cc lidocaine, 2 cc bupivacaine injected easily Completed without difficulty  Advised to call if fevers/chills, erythema, induration, drainage, or persistent bleeding.  Images permanently stored and available for review in PACS.  Impression: Technically successful ultrasound guided injection.  Procedure: Real-time Ultrasound Guided injection of the right subacromial bursa Device: Samsung HS60  Verbal informed consent obtained.  Time-out conducted.  Noted no overlying erythema, induration, or other signs of local infection.  Skin prepped in a sterile fashion.  Local anesthesia: Topical Ethyl chloride.  With sterile technique and under real time ultrasound guidance: Intact cuff noted, 1 cc Kenalog 40, 1 cc lidocaine,1 cc bupivacaine injected easily Completed without difficulty  Advised to call if fevers/chills, erythema, induration, drainage, or persistent bleeding.  Images permanently stored and available for review in PACS.  Impression: Technically successful ultrasound guided injection.  Independent interpretation of notes and tests performed by another provider:   None.  Brief History, Exam, Impression, and Recommendations:    Primary osteoarthritis of right hip Severe pain right hip joint, we did an injection today, return to see me the in 6 weeks, we Argun to get x-rays and do some home therapy.  Impingement syndrome, shoulder, right Failed PT, subacromial injection today, return to see me in 6  weeks.    ____________________________________________ Ihor Austin. Benjamin Stain, M.D., ABFM., CAQSM., AME. Primary Care and Sports Medicine [facility]  Adjunct Professor of Family Medicine  [place] [employer]  Restaurant manager, fast food

## 2024-03-20 ENCOUNTER — Other Ambulatory Visit: Payer: Self-pay | Admitting: Sports Medicine

## 2024-03-20 DIAGNOSIS — M4722 Other spondylosis with radiculopathy, cervical region: Secondary | ICD-10-CM

## 2024-04-02 ENCOUNTER — Ambulatory Visit: Admitting: Sports Medicine

## 2024-05-20 ENCOUNTER — Encounter: Payer: Self-pay | Admitting: Family Medicine

## 2024-05-20 ENCOUNTER — Telehealth: Payer: Self-pay

## 2024-05-20 ENCOUNTER — Ambulatory Visit (INDEPENDENT_AMBULATORY_CARE_PROVIDER_SITE_OTHER): Payer: Medicare Other

## 2024-05-20 VITALS — BP 131/67 | HR 66 | Ht 65.0 in | Wt 194.0 lb

## 2024-05-20 DIAGNOSIS — Z0000 Encounter for general adult medical examination without abnormal findings: Secondary | ICD-10-CM

## 2024-05-20 DIAGNOSIS — Z1211 Encounter for screening for malignant neoplasm of colon: Secondary | ICD-10-CM | POA: Diagnosis not present

## 2024-05-20 DIAGNOSIS — E782 Mixed hyperlipidemia: Secondary | ICD-10-CM

## 2024-05-20 NOTE — Patient Instructions (Signed)
  Brian Donovan , Thank you for taking time to come for your Medicare Wellness Visit. I appreciate your ongoing commitment to your health goals. Please review the following plan we discussed and let me know if I can assist you in the future.   These are the goals we discussed:  Goals       Patient Stated (pt-stated)      04/08/2021 AWV Goal: Exercise for General Health  Patient will verbalize understanding of the benefits of increased physical activity: Exercising regularly is important. It will improve your overall fitness, flexibility, and endurance. Regular exercise also will improve your overall health. It can help you control your weight, reduce stress, and improve your bone density. Over the next year, patient will increase physical activity as tolerated with a goal of at least 150 minutes of moderate physical activity per week.  You can tell that you are exercising at a moderate intensity if your heart starts beating faster and you start breathing faster but can still hold a conversation. Moderate-intensity exercise ideas include: Walking 1 mile (1.6 km) in about 15 minutes Biking Hiking Golfing Dancing Water aerobics Patient will verbalize understanding of everyday activities that increase physical activity by providing examples like the following: Yard work, such as: Insurance underwriter Gardening Washing windows or floors Patient will be able to explain general safety guidelines for exercising:  Before you start a new exercise program, talk with your health care provider. Do not exercise so much that you hurt yourself, feel dizzy, or get very short of breath. Wear comfortable clothes and wear shoes with good support. Drink plenty of water while you exercise to prevent dehydration or heat stroke. Work out until your breathing and your heartbeat get faster.       Patient Stated (pt-stated)       Would like to loose 10 lbs      Patient Stated (pt-stated)      Patient stated that he would like to loose weight.      Patient Stated      Patient states he would like to lose weight.         This is a list of the screening recommended for you and due dates:  Health Maintenance  Topic Date Due   COVID-19 Vaccine (6 - 2024-25 season) 08/19/2023   Colon Cancer Screening  05/18/2024   Flu Shot  07/18/2024   Medicare Annual Wellness Visit  05/20/2025   DTaP/Tdap/Td vaccine (4 - Td or Tdap) 12/13/2032   Pneumonia Vaccine  Completed   Zoster (Shingles) Vaccine  Completed   HPV Vaccine  Aged Out   Meningitis B Vaccine  Aged Out

## 2024-05-20 NOTE — Telephone Encounter (Signed)
 Brian Donovan would like fasting labs before July's appointment.   Pended labs.

## 2024-05-20 NOTE — Progress Notes (Signed)
 Subjective:   Keddrick Wyne is a 83 y.o. male who presents for Medicare Annual/Subsequent preventive examination.  Visit Complete: In person  Patient Medicare AWV questionnaire was completed by the patient on 05/20/2024; I have confirmed that all information answered by patient is correct and no changes since this date.  Cardiac Risk Factors include: advanced age (>73men, >65 women);male gender;dyslipidemia;family history of premature cardiovascular disease;obesity (BMI >30kg/m2)     Objective:    Today's Vitals   05/20/24 1355 05/20/24 1434  BP: (!) 147/75 131/67  Pulse: 66   SpO2: 97%   Weight: 194 lb (88 kg)   Height: 5\' 5"  (1.651 m)    Body mass index is 32.28 kg/m.     05/20/2024    2:13 PM 01/08/2024   10:16 AM 05/16/2023    2:35 PM 04/10/2022   10:39 AM 04/08/2021    9:59 AM  Advanced Directives  Does Patient Have a Medical Advance Directive? Yes Yes Yes Yes Yes  Type of Estate agent of [place];Living will  Living will Living will Healthcare Power of [place];Living will  Does patient want to make changes to medical advance directive? No - Patient declined No - Patient declined No - Patient declined No - Patient declined No - Patient declined  Copy of Healthcare Power of Attorney in Chart? No - copy requested    No - copy requested    Current Medications (verified) Outpatient Encounter Medications as of 05/20/2024  Medication Sig   atorvastatin  (LIPITOR) 20 MG tablet Take 1 tablet by mouth once daily   gabapentin  (NEURONTIN ) 300 MG capsule Take 1 capsule by mouth at bedtime   Glucosamine-Chondroit-Vit C-Mn (GLUCOSAMINE 1500 COMPLEX) CAPS Take by mouth.   meloxicam  (MOBIC ) 15 MG tablet TAKE 1/2 TO 1 (ONE-HALF TO ONE) TABLET BY MOUTH ONCE DAILY AS NEEDED FOR PAIN   Multiple Vitamin (MULTIVITAMIN) tablet Take 1 tablet by mouth daily.   Multiple Vitamins-Minerals (EYE VITAMINS PO) Take by mouth. Areds   RESTASIS 0.05 % ophthalmic emulsion 1 drop 2  (two) times daily.   No facility-administered encounter medications on file as of 05/20/2024.    Allergies (verified) Patient has no known allergies.   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   Prostate cancer [facility])    Past Surgical History:  Procedure Laterality Date   EYE SURGERY Bilateral 2023   [facility]   PROSTATECTOMY     Family History  Problem Relation Age of Onset   Cancer Father    Prostate cancer Father    Colon cancer Father    Social History   Socioeconomic History   Marital status: Married    Spouse name: Mariah Shines [place]   Number of children: 3   Years of education: 15   Highest education level: Associate degree: academic program  Occupational History   Occupation: Retired  Tobacco Use   Smoking status: Never   Smokeless tobacco: Never  Vaping Use   Vaping status: Never Used  Substance and Sexual Activity   Alcohol use: Yes    Comment: Rare   Drug use: Not Currently   Sexual activity: Yes    Partners: Female  Other Topics Concern   Not on  file  Social History Narrative   Lives with his wife. They have three children. He likes to do yardwork in his free time.    Social Drivers of Corporate investment banker Strain: Low Risk  (05/20/2024)   Overall Financial Resource Strain (CARDIA)    Difficulty of Paying Living Expenses: Not hard at all  Food Insecurity: No Food Insecurity (05/20/2024)   Hunger Vital Sign    Worried About Running Out of Food in the Last Year: Never true    Ran Out of Food in the Last Year: Never true  Transportation Needs: No Transportation Needs (05/20/2024)   PRAPARE - Administrator, Civil Service (Medical): No    Lack of  Transportation (Non-Medical): No  Physical Activity: Insufficiently Active (05/20/2024)   Exercise Vital Sign    Days of Exercise per Week: 1 day    Minutes of Exercise per Session: 50 min  Stress: No Stress Concern Present (05/20/2024)   [employer] of Occupational Health - Occupational Stress Questionnaire    Feeling of Stress : Not at all  Social Connections: Moderately Integrated (05/20/2024)   Social Connection and Isolation Panel [NHANES]    Frequency of Communication with Friends and Family: More than three times a week    Frequency of Social Gatherings with Friends and Family: More than three times a week    Attends Religious Services: More than 4 times per year    Active Member of [employer] or Organizations: No    Attends Engineer, structural: Never    Marital Status: Married    Tobacco Counseling Counseling given: Not Answered   Clinical Intake:  Pre-visit preparation completed: Yes  Pain : No/denies pain     BMI - recorded: 32.28 Nutritional Status: BMI > 30  Obese Nutritional Risks: None Diabetes: No  How often do you need to have someone help you when you read instructions, pamphlets, or other written materials from your doctor or pharmacy?: 1 - Never What is the last grade level you completed in school?: 14  Interpreter Needed?: No      Activities of Daily Living    05/20/2024    1:57 PM 05/20/2024   11:11 AM  In your present state of health, do you have any difficulty performing the following activities:  Hearing? 0 0   Vision? 0 0   Difficulty concentrating or making decisions? 0 0   Walking or climbing stairs? 0 0   Dressing or bathing? 0 0   Doing errands, shopping? 0 0   Preparing Food and eating ? N N   Using the Toilet? N N   In the past six months, have you accidently leaked urine? Y Y   Do you have problems with loss of bowel control? N N   Managing your Medications? N N   Managing your Finances? N N   Housekeeping or managing your  Housekeeping? Francisco Irving      Proxy-reported    Patient Care Team: Adela Holter, DO as PCP - General (Family Medicine) Gean Keels, MD as Consulting Physician (Family Medicine)  Indicate any recent Medical Services you may have received from other than [facility] providers in the past year (date may be approximate).     Assessment:   This is a routine wellness examination for Sarah.  Hearing/Vision screen No results found.   Goals Addressed             This Visit's Progress    Patient  Stated       Patient states he would like to lose weight.       Depression Screen    05/20/2024    2:06 PM 05/16/2023    2:35 PM 02/15/2023    3:16 PM 04/10/2022   10:40 AM 04/08/2021   10:13 AM 03/25/2020    2:18 PM  PHQ 2/9 Scores  PHQ - 2 Score 0 0 0 0 0 0  PHQ- 9 Score      6    Fall Risk    05/20/2024    2:14 PM 05/20/2024   11:11 AM 05/16/2023    2:35 PM 02/15/2023    3:16 PM 04/10/2022   10:39 AM  Fall Risk   Falls in the past year? 0 0  0 0 0  Number falls in past yr: 0 0  0 0 0  Injury with Fall? 0 0  0 0 0  Risk for fall due to : No Fall Risks  No Fall Risks No Fall Risks No Fall Risks  Follow up Falls evaluation completed  Falls evaluation completed Falls evaluation completed Falls evaluation completed     Proxy-reported    MEDICARE RISK AT HOME: Medicare Risk at Home Any stairs in or around the home?: Yes If so, are there any without handrails?: No Home free of loose throw rugs in walkways, pet beds, electrical cords, etc?: Yes Adequate lighting in your home to reduce risk of falls?: Yes Life alert?: No Use of a cane, walker or w/c?: No Grab bars in the bathroom?: No Shower chair or bench in shower?: (Proxy-Rptd) No Elevated toilet seat or a handicapped toilet?: No  TIMED UP AND GO:  Was the test performed?  Yes  Length of time to ambulate 10 feet: 10 sec Gait steady and fast without use of assistive device    Cognitive Function:        05/20/2024    2:15 PM  05/16/2023    3:06 PM 04/10/2022   10:31 AM 04/08/2021   10:21 AM  6CIT Screen  What Year? 0 points 0 points 0 points 0 points  What month? 0 points 0 points 0 points 0 points  What time? 0 points 0 points 0 points 0 points  Count back from 20 0 points 0 points 0 points 0 points  Months in reverse 0 points 0 points 0 points 0 points  Repeat phrase 0 points 2 points 0 points 0 points  Total Score 0 points 2 points 0 points 0 points    Immunizations Immunization History  Administered Date(s) Administered   Fluad Quad(high Dose 65+) 09/23/2020, 10/04/2021, 09/27/2022   Fluad Trivalent(High Dose 65+) 01/21/2024   H1N1 12/17/2008   Influenza Inj Mdck Quad Pf 08/21/2017, 09/13/2018   Influenza, Seasonal, Injecte, Preservative Fre 10/08/2009, 09/19/2010, 09/19/2011, 09/17/2012, 10/28/2013, 09/18/2014   Influenza,inj,Quad PF,6+ Mos 09/03/2015, 09/12/2016, 10/01/2019   Influenza-Unspecified 10/04/1995, 10/16/1997, 10/16/2002, 10/06/2004, 11/01/2005, 10/12/2006, 10/15/2007   PFIZER(Purple Top)SARS-COV-2 Vaccination 02/09/2020, 03/01/2020, 09/24/2020, 09/27/2022   PNEUMOCOCCAL CONJUGATE-20 04/08/2021   PPD Test 03/18/2014   Pfizer Covid-19 Vaccine Bivalent Booster 46yrs & up 10/04/2021   Pfizer(Comirnaty)Fall Seasonal Vaccine 12 years and older 09/27/2022   Pneumococcal Conjugate-13 07/30/2015   Pneumococcal Polysaccharide-23 05/23/2007   Td 09/15/1998   Tdap 09/19/2010, 12/13/2022   Zoster Recombinant(Shingrix) 09/27/2022, 12/13/2022   Zoster, Live 12/22/2008, 09/17/2012    TDAP status: Up to date  Flu Vaccine status: Up to date  Pneumococcal vaccine status: Up to date  Covid-19 vaccine  status: Information provided on how to obtain vaccines.   Qualifies for Shingles Vaccine? Yes   Zostavax completed Yes   Shingrix Completed?: Yes  Screening Tests Health Maintenance  Topic Date Due   COVID-19 Vaccine (6 - 2024-25 season) 08/19/2023   Colonoscopy  05/18/2024   INFLUENZA VACCINE   07/18/2024   Medicare Annual Wellness (AWV)  05/20/2025   DTaP/Tdap/Td (4 - Td or Tdap) 12/13/2032   Pneumonia Vaccine 63+ Years old  Completed   Zoster Vaccines- Shingrix  Completed   HPV VACCINES  Aged Out   Meningococcal B Vaccine  Aged Out    Health Maintenance  Health Maintenance Due  Topic Date Due   COVID-19 Vaccine (6 - 2024-25 season) 08/19/2023   Colonoscopy  05/18/2024    Colorectal cancer screening: Referral to GI placed 05/20/2024. Pt aware the office will call re: appt.  Lung Cancer Screening: (Low Dose CT Chest recommended if Age 62-80 years, 20 pack-year currently smoking OR have quit w/in 15years.) does not qualify.   Lung Cancer Screening Referral: n/a  Additional Screening:  Hepatitis C Screening: does not qualify; Completed   Vision Screening: Recommended annual ophthalmology exams for early detection of glaucoma and other disorders of the eye. Is the patient up to date with their annual eye exam?  Yes  Who is the provider or what is the name of the office in which the patient attends annual eye exams? Dr Francine Iron If pt is not established with a provider, would they like to be referred to a provider to establish care? N/a.   Dental Screening: Recommended annual dental exams for proper oral hygiene   Community Resource Referral / Chronic Care Management: CRR required this visit?  Yes   CCM required this visit?  No     Plan:     I have personally reviewed and noted the following in the patient's chart:   Medical and social history Use of alcohol, tobacco or illicit drugs  Current medications and supplements including opioid prescriptions. Patient is not currently taking opioid prescriptions. Functional ability and status Nutritional status Physical activity Advanced directives List of other physicians Hospitalizations # 0, surgeries # 0, and ER # 1 visits in previous 12 months Vitals Screenings to include cognitive, depression, and  falls Referrals and appointments  In addition, I have reviewed and discussed with patient certain preventive protocols, quality metrics, and best practice recommendations. A written personalized care plan for preventive services as well as general preventive health recommendations were provided to patient.     Aubrey Leaf, CMA   05/20/2024   After Visit Summary: (In Person-Declined) Patient declined AVS at this time.  Nurse Notes:   Aseel Mccarney is a 83 y.o. male patient of Adela Holter, DO who had a Medicare Annual Wellness Visit today. Isaiahs is Retired and lives with their spouse. he has 3 children. He reports that he is socially active and does interact with friends/family regularly. He is moderately physically active and enjoys yard work.

## 2024-05-28 NOTE — Telephone Encounter (Signed)
 Called and left a detailed voice mail message on patient home # ( allowed on DPR )

## 2024-05-29 NOTE — Telephone Encounter (Signed)
 Patient informed states will come in for lab work to be drawn after July 10th due to insurance coverage through medicare.

## 2024-06-12 ENCOUNTER — Encounter: Payer: Self-pay | Admitting: Physician Assistant

## 2024-06-26 ENCOUNTER — Ambulatory Visit: Admitting: Physician Assistant

## 2024-06-26 ENCOUNTER — Encounter: Payer: Self-pay | Admitting: Physician Assistant

## 2024-06-26 VITALS — BP 130/80 | Ht 65.0 in | Wt 199.0 lb

## 2024-06-26 DIAGNOSIS — Z860101 Personal history of adenomatous and serrated colon polyps: Secondary | ICD-10-CM

## 2024-06-26 DIAGNOSIS — Z860100 Personal history of colon polyps, unspecified: Secondary | ICD-10-CM

## 2024-06-26 DIAGNOSIS — Z09 Encounter for follow-up examination after completed treatment for conditions other than malignant neoplasm: Secondary | ICD-10-CM | POA: Diagnosis not present

## 2024-06-26 DIAGNOSIS — Z800 Family history of malignant neoplasm of digestive organs: Secondary | ICD-10-CM | POA: Diagnosis not present

## 2024-06-26 NOTE — Progress Notes (Signed)
 Ellouise Console, PA-C [address] [place], [place]  [address] Phone: [phone]   Gastroenterology Consultation  Referring Provider:     Alvia Bring, DO Primary Care Physician:  Alvia Bring, DO Primary Gastroenterologist:  Ellouise Console, PA-C / Dr. Gordy Starch  Reason for Consultation:     Discuss repeat colonoscopy        HPI:   Brian Donovan is a 83 y.o. y/o male referred for consultation & management  by Alvia Bring, DO.  He is here to discuss repeat colonoscopy.  10/2018 last screening colonoscopy done in [place]  (age 34): Left-sided diverticulosis.  Internal hemorrhoids.  Otherwise normal.  No polyps.  No biopsies.  Adequate prep.  No further colonoscopies were recommended due to advanced age.  2016 colonoscopy in [place] : 3 Small Tubular Adenoma colon polyps (size 4mm - 7mm) removed.  No dysplasia.  PMH: Patient has remote history of colon polyps and family history of colon cancer (father age 54).  History of GERD, prostate cancer, osteoarthritis, and hyperlipidemia.  Takes 81 mg aspirin daily.  No other blood thinners.  Current symptoms: He denies any GI symptoms such as abdominal pain, heartburn, dysphagia, bowel irregularities, or rectal bleeding.  He is in very good health for his age.  No GI concerns today.  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 Authorizing Provider  atorvastatin  (LIPITOR) 20 MG tablet Take 1 tablet by mouth once  daily 11/06/23   Alvia Bring, DO  gabapentin  (NEURONTIN ) 300 MG capsule Take 1 capsule by mouth at bedtime 03/24/24   Curtis Debby PARAS, MD  Glucosamine-Chondroit-Vit C-Mn (GLUCOSAMINE 1500 COMPLEX) CAPS Take by mouth.    [provider]  meloxicam  (MOBIC ) 15 MG tablet TAKE 1/2 TO 1 (ONE-HALF TO ONE) TABLET BY MOUTH ONCE DAILY AS NEEDED FOR PAIN 07/07/22   Alvia Bring, DO  Multiple Vitamin (MULTIVITAMIN) tablet Take 1 tablet by mouth daily.    [provider]  Multiple Vitamins-Minerals (EYE VITAMINS PO) Take by mouth. Areds    [provider]  RESTASIS 0.05 % ophthalmic emulsion 1 drop 2 (two) times daily. 05/02/23   [provider]    Family History  Problem Relation Age of Onset   Cancer Father    Prostate cancer Father    Colon cancer Father      Social History   Tobacco Use   Smoking status: Never   Smokeless tobacco: Never  Vaping Use   Vaping status: Never Used  Substance Use Topics   Alcohol use: Yes    Comment: Rare   Drug use: Not Currently    Allergies as of 06/26/2024   (No Known Allergies)    Review of Systems:    All systems reviewed and negative except where noted in HPI.   Physical Exam:  BP 130/80   Ht 5' 5 (1.651 m)   Wt 199 lb (90.3 kg)  BMI 33.12 kg/m  No LMP for male patient.  General:   Alert,  Well-developed, well-nourished, pleasant and cooperative in NAD.  Appears younger than stated age.  Walks well with no assistive devices.  Neurologic:  Alert and oriented x3;  grossly normal neurologically. Psych:  Alert and cooperative. Normal mood and affect.  Imaging Studies: No results found.  Labs: CBC    Component Value Date/Time   WBC 6.3 06/26/2023 1515   RBC 4.94 06/26/2023 1515   HGB 14.7 06/26/2023 1515   HCT 45.5 06/26/2023 1515   PLT 222 06/26/2023 1515   MCV 92.1 06/26/2023 1515   MCH 29.8 06/26/2023 1515   MCHC 32.3 06/26/2023 1515   RDW 12.9 06/26/2023 1515   LYMPHSABS 1,821  06/26/2023 1515   EOSABS 57 06/26/2023 1515   BASOSABS 50 06/26/2023 1515    CMP     Component Value Date/Time   NA 142 06/22/2023 0846   K 4.9 06/22/2023 0846   CL 108 06/22/2023 0846   CO2 26 06/22/2023 0846   GLUCOSE 105 (H) 06/22/2023 0846   BUN 26 (H) 06/22/2023 0846   CREATININE 1.03 06/22/2023 0846   CALCIUM  8.8 06/22/2023 0846   PROT 6.3 06/22/2023 0846   AST 21 06/22/2023 0846   ALT 18 06/22/2023 0846   BILITOT 0.5 06/22/2023 0846   GFRNONAA 65 04/07/2021 0000   GFRAA 75 04/07/2021 0000    Assessment and Plan:   Raquel Mahany is a 83 y.o. y/o male has been referred for:   1.  History of adenomatous colon polyps: He had 3 small tubular adenoma polyps removed in 2016.  Last colonoscopy in 2019 showed no polyps.  2.  Family history of colon cancer: Father age 32  Plan: - Repeat Colonoscopy is not recommended. - Current guidelines do not recommend any more repeat surveillance colonoscopies after age 67 or 79 due to increased risk of procedure, including risk of bleeding, colon perforation, and risk of sedation. - We are happy to see patient back in the future if he develops any GI symptoms. - Colon cancer screening guidelines were discussed at length.  Follow up As Needed if he develops any GI symptoms.  Ellouise Console, PA-C

## 2024-06-26 NOTE — Patient Instructions (Signed)
 Follow up as needed   _______________________________________________________  If your blood pressure at your visit was 140/90 or greater, please contact your primary care physician to follow up on this.  _______________________________________________________  If you are age 83 or older, your body mass index should be between 23-30. Your Body mass index is 33.12 kg/m. If this is out of the aforementioned range listed, please consider follow up with your Primary Care Provider.  If you are age 90 or younger, your body mass index should be between 19-25. Your Body mass index is 33.12 kg/m. If this is out of the aformentioned range listed, please consider follow up with your Primary Care Provider.   ________________________________________________________  The [facility] providers would like to encourage you to use MYCHART to communicate with providers for non-urgent requests or questions.  Due to long hold times on the telephone, sending your provider a message by [facility] may be a faster and more efficient way to get a response.  Please allow 48 business hours for a response.  Please remember that this is for non-urgent requests.  _______________________________________________________   Thank you for entrusting me with your care and choosing [facility].  Ellouise Console PA-C

## 2024-06-28 LAB — CBC WITH DIFFERENTIAL/PLATELET
Basophils Absolute: 0.1 x10E3/uL (ref 0.0–0.2)
Basos: 1 %
EOS (ABSOLUTE): 0.1 x10E3/uL (ref 0.0–0.4)
Eos: 2 %
Hematocrit: 42.9 % (ref 37.5–51.0)
Hemoglobin: 13.7 g/dL (ref 13.0–17.7)
Immature Grans (Abs): 0 x10E3/uL (ref 0.0–0.1)
Immature Granulocytes: 0 %
Lymphocytes Absolute: 1.3 x10E3/uL (ref 0.7–3.1)
Lymphs: 30 %
MCH: 30.6 pg (ref 26.6–33.0)
MCHC: 31.9 g/dL (ref 31.5–35.7)
MCV: 96 fL (ref 79–97)
Monocytes Absolute: 0.6 x10E3/uL (ref 0.1–0.9)
Monocytes: 13 %
Neutrophils Absolute: 2.3 x10E3/uL (ref 1.4–7.0)
Neutrophils: 53 %
Platelets: 173 x10E3/uL (ref 150–450)
RBC: 4.48 x10E6/uL (ref 4.14–5.80)
RDW: 12.5 % (ref 11.6–15.4)
WBC: 4.4 x10E3/uL (ref 3.4–10.8)

## 2024-06-28 LAB — CMP14+EGFR
ALT: 19 IU/L (ref 0–44)
AST: 19 IU/L (ref 0–40)
Albumin: 4.2 g/dL (ref 3.7–4.7)
Alkaline Phosphatase: 91 IU/L (ref 44–121)
BUN/Creatinine Ratio: 26 — ABNORMAL HIGH (ref 10–24)
BUN: 24 mg/dL (ref 8–27)
Bilirubin Total: 0.7 mg/dL (ref 0.0–1.2)
CO2: 21 mmol/L (ref 20–29)
Calcium: 8.7 mg/dL (ref 8.6–10.2)
Chloride: 104 mmol/L (ref 96–106)
Creatinine, Ser: 0.92 mg/dL (ref 0.76–1.27)
Globulin, Total: 1.7 g/dL (ref 1.5–4.5)
Glucose: 98 mg/dL (ref 70–99)
Potassium: 4.5 mmol/L (ref 3.5–5.2)
Sodium: 142 mmol/L (ref 134–144)
Total Protein: 5.9 g/dL — ABNORMAL LOW (ref 6.0–8.5)
eGFR: 83 mL/min/1.73 (ref >59)

## 2024-06-28 LAB — LIPID PANEL
Chol/HDL Ratio: 3.3 ratio (ref 0.0–5.0)
Cholesterol, Total: 124 mg/dL (ref 100–199)
HDL: 38 mg/dL — ABNORMAL LOW (ref >39)
LDL Chol Calc (NIH): 68 mg/dL (ref 0–99)
Triglycerides: 91 mg/dL (ref 0–149)
VLDL Cholesterol Cal: 18 mg/dL (ref 5–40)

## 2024-07-07 ENCOUNTER — Encounter: Payer: Self-pay | Admitting: Family Medicine

## 2024-07-07 ENCOUNTER — Ambulatory Visit (INDEPENDENT_AMBULATORY_CARE_PROVIDER_SITE_OTHER): Admitting: Family Medicine

## 2024-07-07 VITALS — BP 128/66 | HR 67 | Ht 65.0 in | Wt 199.0 lb

## 2024-07-07 DIAGNOSIS — N39498 Other specified urinary incontinence: Secondary | ICD-10-CM | POA: Diagnosis not present

## 2024-07-07 DIAGNOSIS — E782 Mixed hyperlipidemia: Secondary | ICD-10-CM | POA: Diagnosis not present

## 2024-07-07 DIAGNOSIS — L819 Disorder of pigmentation, unspecified: Secondary | ICD-10-CM | POA: Diagnosis not present

## 2024-07-07 MED ORDER — MIRABEGRON ER 25 MG PO TB24: 25.0000 mg | ORAL_TABLET | Freq: Every day | ORAL | 3 refills | Status: AC

## 2024-07-07 NOTE — Assessment & Plan Note (Signed)
 Will see if Myrbetriq  is helpful for management of the symptoms.

## 2024-07-07 NOTE — Progress Notes (Signed)
 Brian Donovan - 83 y.o. male MRN [id number]  Date of birth: 01-29-1941  Subjective Chief Complaint  Patient presents with   Cyst   Nevus   head itching   Urinary Incontinence    HPI Brian Donovan is a 83 y.o. male here today for follow up visit.   Reports that he is doing okay.  He has a couple of skin lesions he would like me to take a look at.  One area is pigmented on the left leg and has changed in size.  She also continues to deal with some incontinence issues.  He does use a incontinence pad.  He did try Flomax without relief.  He has not tried medication for bladder spasm.   Remains on atorvastatin  for management of HLD.  He is toleraitng this well. He had labs prior to visit with LDL of 68.    He continues to see Dr. Curtis for orthopedic issues including hip and neck pain.  Overall these are stable.     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   Prostate cancer [facility])     Past Surgical History:  Procedure Laterality Date   COLONOSCOPY     EYE SURGERY Bilateral 2023   [facility]   PROSTATECTOMY      Social History   Socioeconomic History   Marital status: Married    Spouse name: Darice Des'Scisciolo   Number of children: 3   Years of education: 15   Highest education level: Associate degree: academic program  Occupational History   Occupation: Retired  Tobacco Use   Smoking status: Never   Smokeless tobacco: Never  Vaping Use   Vaping status: Never Used  Substance and Sexual Activity   Alcohol use: Yes    Comment: Rare   Drug use: Not Currently   Sexual activity: Yes     Partners: Female  Other Topics Concern   Not on file  Social History Narrative   Lives with his wife. They have three children. He likes to do yardwork in his free time.    Social Drivers of Corporate investment banker Strain: Low Risk  (05/20/2024)   Overall Financial Resource Strain (CARDIA)    Difficulty of Paying Living Expenses: Not hard at all  Food Insecurity: No Food Insecurity (05/20/2024)   Hunger Vital Sign    Worried About Running Out of Food in the Last Year: Never true    Ran Out of Food in the Last Year: Never true  Transportation Needs: No Transportation Needs (05/20/2024)   PRAPARE - Administrator, Civil Service (Medical): No    Lack of Transportation (Non-Medical): No  Physical Activity: Insufficiently Active (05/20/2024)   Exercise Vital Sign    Days of Exercise per Week: 1 day    Minutes of Exercise per Session: 50 min  Stress: No Stress Concern Present (05/20/2024)   [employer] of Occupational Health - Occupational Stress Questionnaire    Feeling of Stress : Not at all  Social Connections: Moderately Integrated (05/20/2024)   Social Connection and Isolation Panel    Frequency  of Communication with Friends and Family: More than three times a week    Frequency of Social Gatherings with Friends and Family: More than three times a week    Attends Religious Services: More than 4 times per year    Active Member of [employer] or Organizations: No    Attends Banker Meetings: Never    Marital Status: Married    Family History  Problem Relation Age of Onset   Cancer Father    Prostate cancer Father    Colon cancer Father     Health Maintenance  Topic Date Due   COVID-19 Vaccine (6 - 2024-25 season) 08/19/2023   Colonoscopy  05/18/2024   INFLUENZA VACCINE  07/18/2024   Medicare Annual Wellness (AWV)  05/20/2025   DTaP/Tdap/Td (4 - Td or Tdap) 12/13/2032   Pneumococcal Vaccine: 50+ Years  Completed   Zoster Vaccines- Shingrix  Completed    Hepatitis B Vaccines  Aged Out   HPV VACCINES  Aged Out   Meningococcal B Vaccine  Aged Out     ----------------------------------------------------------------------------------------------------------------------------------------------------------------------------------------------------------------- Physical Exam BP 128/66 (BP Location: Right Arm, Patient Position: Sitting, Cuff Size: Normal)   Pulse 67   Ht 5' 5 (1.651 m)   Wt 199 lb (90.3 kg)   SpO2 97%   BMI 33.12 kg/m   Physical Exam Constitutional:      Appearance: Normal appearance.  HENT:     Head: Normocephalic and atraumatic.  Eyes:     General: No scleral icterus. Cardiovascular:     Rate and Rhythm: Normal rate and regular rhythm.  Pulmonary:     Effort: Pulmonary effort is normal.     Breath sounds: Normal breath sounds.  Musculoskeletal:     Cervical back: Neck supple.  Neurological:     General: No focal deficit present.     Mental Status: He is alert.  Psychiatric:        Mood and Affect: Mood normal.        Behavior: Behavior normal.     ------------------------------------------------------------------------------------------------------------------------------------------------------------------------------------------------------------------- Assessment and Plan  Urinary incontinence Will see if Myrbetriq  is helpful for management of the symptoms.  Mixed hyperlipidemia Doing well with atorvastatin  at current strength.  Lab Results  Component Value Date   LDLCALC 68 06/27/2024    CKD (chronic kidney disease) stage 2, GFR 60-89 ml/min Lab Results  Component Value Date   NA 142 06/27/2024   CL 104 06/27/2024   K 4.5 06/27/2024   CO2 21 06/27/2024   BUN 24 06/27/2024   CREATININE 0.92 06/27/2024   EGFR 83 06/27/2024   CALCIUM  8.7 06/27/2024   ALBUMIN 4.2 06/27/2024   GLUCOSE 98 06/27/2024   Stable at this time.    Meds ordered this encounter  Medications   mirabegron  ER  (MYRBETRIQ ) 25 MG TB24 tablet    Sig: Take 1 tablet (25 mg total) by mouth daily.    Dispense:  90 tablet    Refill:  3    No follow-ups on file.

## 2024-07-07 NOTE — Assessment & Plan Note (Signed)
 Doing well with atorvastatin  at current strength.  Lab Results  Component Value Date   LDLCALC 68 06/27/2024

## 2024-07-07 NOTE — Patient Instructions (Signed)
 Try myrbetriq  for incontinence.

## 2024-07-07 NOTE — Assessment & Plan Note (Signed)
 Lab Results  Component Value Date   NA 142 06/27/2024   CL 104 06/27/2024   K 4.5 06/27/2024   CO2 21 06/27/2024   BUN 24 06/27/2024   CREATININE 0.92 06/27/2024   EGFR 83 06/27/2024   CALCIUM  8.7 06/27/2024   ALBUMIN 4.2 06/27/2024   GLUCOSE 98 06/27/2024   Stable at this time.

## 2024-07-07 NOTE — Addendum Note (Signed)
 Addended by: Miyoko Hashimi E on: 07/07/2024 10:25 PM   Modules accepted: Orders

## 2024-07-08 ENCOUNTER — Other Ambulatory Visit: Payer: Self-pay | Admitting: Family Medicine

## 2024-08-04 ENCOUNTER — Other Ambulatory Visit: Payer: Self-pay | Admitting: Family Medicine

## 2024-08-19 ENCOUNTER — Encounter: Payer: Self-pay | Admitting: Sports Medicine

## 2024-10-08 ENCOUNTER — Other Ambulatory Visit: Payer: Self-pay | Admitting: Family Medicine

## 2024-10-08 DIAGNOSIS — E782 Mixed hyperlipidemia: Secondary | ICD-10-CM

## 2024-10-22 ENCOUNTER — Ambulatory Visit

## 2024-10-22 ENCOUNTER — Ambulatory Visit: Payer: Self-pay

## 2024-10-22 ENCOUNTER — Ambulatory Visit (INDEPENDENT_AMBULATORY_CARE_PROVIDER_SITE_OTHER): Admitting: Family Medicine

## 2024-10-22 ENCOUNTER — Encounter: Payer: Self-pay | Admitting: Family Medicine

## 2024-10-22 VITALS — BP 141/68 | HR 66 | Ht 65.0 in | Wt 201.0 lb

## 2024-10-22 DIAGNOSIS — M79641 Pain in right hand: Secondary | ICD-10-CM | POA: Insufficient documentation

## 2024-10-22 NOTE — Assessment & Plan Note (Signed)
 Xrays of R hand ordered.  Fitted for wrist brace as well. Continue meloxicam  and use ice to area.

## 2024-10-22 NOTE — Progress Notes (Addendum)
 Brian Donovan - 83 y.o. male MRN [id number]  Date of birth: 16-Dec-1941  Subjective Chief Complaint  Patient presents with   Hand Pain    Right- aching pain all over - 8/10 pain level- losing grip    HPI Brian Donovan is a 83 y.o. male here today with complaint of hand pain.  Symptoms started a few days ago.  He has diminished grip in his hand as well.  He notices more in the morning and improves during the day.  He notices most in the middle knuckle. He did punch a jar lid a few days ago to help with opening this.  He also has some wrist pain with mild numbness/tingling. Tried meloxicam  but hasn't really helped so far but just started yesterday.   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   Prostate cancer [facility])     Past Surgical History:  Procedure Laterality Date   COLONOSCOPY     EYE SURGERY Bilateral 2023   [facility]   PROSTATECTOMY      Social History   Socioeconomic History   Marital status: Married    Spouse name: Darice Des'Scisciolo   Number of children: 3   Years of education: 15   Highest education level: Associate degree: academic program  Occupational History   Occupation: Retired  Tobacco Use   Smoking status: Never   Smokeless tobacco: Never  Vaping Use   Vaping status: Never Used  Substance and Sexual Activity   Alcohol use: Yes    Comment: Rare   Drug use: Not Currently   Sexual activity: Yes    Partners: Female  Other Topics Concern   Not on file  Social History Narrative   Lives with his wife. They have three children. He likes to do yardwork in his free time.     Social Drivers of Corporate Investment Banker Strain: Low Risk  (05/20/2024)   Overall Financial Resource Strain (CARDIA)    Difficulty of Paying Living Expenses: Not hard at all  Food Insecurity: No Food Insecurity (05/20/2024)   Hunger Vital Sign    Worried About Running Out of Food in the Last Year: Never true    Ran Out of Food in the Last Year: Never true  Transportation Needs: No Transportation Needs (05/20/2024)   PRAPARE - Administrator, Civil Service (Medical): No    Lack of Transportation (Non-Medical): No  Physical Activity: Insufficiently Active (05/20/2024)   Exercise Vital Sign    Days of Exercise per Week: 1 day    Minutes of Exercise per Session: 50 min  Stress: No Stress Concern Present (05/20/2024)   [employer] of Occupational Health - Occupational Stress Questionnaire    Feeling of Stress : Not at all  Social Connections: Moderately Integrated (05/20/2024)   Social Connection and Isolation Panel    Frequency of Communication with Friends and Family: More than three times a week    Frequency of Social Gatherings with Friends and Family: More than three times a week    Attends Religious Services: More than 4 times per year  Active Member of Clubs or Organizations: No    Attends Banker Meetings: Never    Marital Status: Married    Family History  Problem Relation Age of Onset   Cancer Father    Prostate cancer Father    Colon cancer Father     Health Maintenance  Topic Date Due   Colonoscopy  05/18/2024   COVID-19 Vaccine (6 - 2025-26 season) 11/07/2024 (Originally 08/18/2024)   Influenza Vaccine  03/17/2025 (Originally 07/18/2024)   Medicare Annual Wellness (AWV)  05/20/2025   DTaP/Tdap/Td (4 - Td or Tdap) 12/13/2032   Pneumococcal Vaccine: 50+ Years  Completed   Zoster Vaccines- Shingrix  Completed   Meningococcal B Vaccine  Aged Out      ----------------------------------------------------------------------------------------------------------------------------------------------------------------------------------------------------------------- Physical Exam BP (!) 141/68 (BP Location: Left Arm, Patient Position: Sitting, Cuff Size: Normal)   Pulse 66   Ht 5' 5 (1.651 m)   Wt 201 lb (91.2 kg)   SpO2 97%   BMI 33.45 kg/m   Physical Exam Constitutional:      Appearance: Normal appearance.  HENT:     Head: Normocephalic and atraumatic.  Eyes:     General: No scleral icterus. Cardiovascular:     Rate and Rhythm: Normal rate and regular rhythm.  Pulmonary:     Effort: Pulmonary effort is normal.  Musculoskeletal:     Comments: Swelling and TTP of MCP joint of middle finger on R hand  Neurological:     General: No focal deficit present.     Mental Status: He is alert.  Psychiatric:        Mood and Affect: Mood normal.        Behavior: Behavior normal.     ------------------------------------------------------------------------------------------------------------------------------------------------------------------------------------------------------------------- Assessment and Plan  Pain of right hand Xrays of R hand ordered.  Fitted for wrist brace as well. Continue meloxicam  and use ice to area.     No orders of the defined types were placed in this encounter.   No follow-ups on file.

## 2024-10-22 NOTE — Patient Instructions (Signed)
 Continue meloxicam  Ice area 2-3x per day.  Use splint as needed at night.

## 2024-10-22 NOTE — Telephone Encounter (Signed)
 FYI Only or Action Required?: FYI only for provider: appointment scheduled on 10/22/2024 at 1:10pm.  Patient was last seen in primary care on 07/07/2024 by Brian Bring, DO.  Called Nurse Triage reporting Hand Pain.  Symptoms began a week ago.  Interventions attempted: Prescription medications: Meloxicam  and Gabapentin .  Symptoms are: gradually worsening.  Triage Disposition: See HCP Within 4 Hours (Or PCP Triage)  Patient/caregiver understands and will follow disposition?: Yes            Copied from CRM #[id number]. Topic: Clinical - Red Word Triage >> Oct 22, 2024  8:57 AM Brian Donovan wrote: [facility] that prompted transfer to Nurse Triage: Pt initially called to schedule an appt due to possibly having a broken bone in her hand, pt states that he's unable to grip anything and experiences severe pain that keeps him up at night. Reason for Disposition  [1] SEVERE pain (e.Donovan., excruciating, unable to use hand at all) AND [2] not improved after 2 hours of pain medicine  Answer Assessment - Initial Assessment Questions Patient is advised to call us  back if anything changes or with any further questions/concerns. Patient is advised that if anything worsens to go to the Emergency Room. Patient verbalized understanding.   1. ONSET: When did the pain start?     A week 2. LOCATION: Where is the pain located?     Right hand mainly around middle finger 3. PAIN: How bad is the pain? (Scale 1-10; or mild, moderate, severe)     8-9 in the middle of the night, tolerable 4-5 during the day 4. WORK OR EXERCISE: Has there been any recent work or exercise that involved this part (i.e., hand or wrist) of the body?     Daily use 5. CAUSE: What do you think is causing the pain?     unsure 6. AGGRAVATING FACTORS: What makes the pain worse? (e.Donovan., using computer)     ----- 7. OTHER SYMPTOMS: Do you have any other symptoms? (e.Donovan., fever, neck pain, numbness or tingling, rash,  swelling)     denies  Protocols used: Hand Pain-A-AH

## 2024-10-23 ENCOUNTER — Ambulatory Visit: Payer: Self-pay | Admitting: Family Medicine

## 2024-12-08 ENCOUNTER — Ambulatory Visit: Payer: Self-pay

## 2024-12-08 NOTE — Telephone Encounter (Signed)
 FYI Only or Action Required?: Action required by provider: update on patient condition.  Patient was last seen in primary care on 10/22/2024 by Alvia Bring, DO.  Called Nurse Triage reporting Eye Problem.  Symptoms began several days ago.  Interventions attempted: OTC medications: Restasis eye drops bid.  Symptoms are: gradually worsening.  Triage Disposition: Go to ED Now (or PCP Triage)  Patient/caregiver understands and will follow disposition?: No, wishes to speak with PCP         Copied from CRM #[id number]. Topic: Clinical - Red Word Triage >> Dec 08, 2024  8:57 AM Mia F wrote: Red Word that prompted transfer to Nurse Triage: Severely blood shot red (blood clot) eye in one eye and the other eye is beginning to do the same. There is some slight swelling. Not in pain but there is a sensation in the eye. He says he has a slight headache. Has been going on for about two days. Reason for Disposition  [1] Blurred vision or visual changes AND [2] present now AND [3] sudden onset or new (e.g., minutes, hours, days)  (Exception: Seeing floaters / black specks OR previously diagnosed migraine headaches with same symptoms.)  Answer Assessment - Initial Assessment Questions This RN recommends pt goes to ED but pt refused. Pt wants to come in for an appointment to see PCP. This RN notified [facility] of pt refusal of ED disposition. Pt wants a call back today at [phone].  Symptoms: Right eye bloodshot x2 days; patient reports the entire white of the eye is dark red Sticky discharge noted on right eyelashes Mild swelling of right eye Mild blurry vision in right eye Per spouse, redness appears like a blood clot covering the iris Left eye with mild redness, less severe than right Foreign-body sensation in corner of right eye (feels like sand) Headache  Denies: Eye pain Patient states the eye doesn't feel right.  Protocols used: Vision Loss or Change-A-AH

## 2024-12-08 NOTE — Telephone Encounter (Signed)
 Patient advised.

## 2024-12-08 NOTE — Telephone Encounter (Signed)
 Patient scheduled.

## 2024-12-09 ENCOUNTER — Encounter: Payer: Self-pay | Admitting: Family Medicine

## 2024-12-09 ENCOUNTER — Ambulatory Visit: Admitting: Family Medicine

## 2024-12-09 DIAGNOSIS — H1131 Conjunctival hemorrhage, right eye: Secondary | ICD-10-CM | POA: Diagnosis not present

## 2024-12-09 DIAGNOSIS — H1130 Conjunctival hemorrhage, unspecified eye: Secondary | ICD-10-CM | POA: Insufficient documentation

## 2024-12-09 NOTE — Progress Notes (Signed)
 " Brian Donovan - 83 y.o. male MRN [id number]  Date of birth: 12-03-1941  Subjective Chief Complaint  Patient presents with   Eye Problem    Right- onset 3-4 days ago  Pt states no pain more like discomfort - states no notice to change in vision  There is visible bright redness around the iris     HPI Brian Donovan is a 83 y.o. male here today with complaint of Red eye.  Symptoms started about 3 days ago..  Notes blood in the R eye.  He has mild eye irritation but denies pain, vision changes, headache or light sensitivity.  He does not recall any specific trauma but does sneeze heavily each morning.    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   Prostate cancer [facility])     Past Surgical History:  Procedure Laterality Date   COLONOSCOPY     EYE SURGERY Bilateral 2023   [facility]   PROSTATECTOMY      Social History   Socioeconomic History   Marital status: Married    Spouse name: Darice Des'Scisciolo   Number of children: 3   Years of education: 15   Highest education level: Associate degree: academic program  Occupational History   Occupation: Retired  Tobacco Use   Smoking status: Never   Smokeless tobacco: Never  Vaping Use   Vaping status: Never Used  Substance and Sexual Activity   Alcohol use: Yes    Comment: Rare   Drug use: Not Currently   Sexual activity: Yes    Partners: Female  Other Topics Concern   Not on file  Social History Narrative   Lives with his wife. They have three children. He likes to do yardwork in his free time.    Social Drivers of Health   Tobacco Use: Low Risk  (12/09/2024)   Patient History    Smoking Tobacco Use: Never    Smokeless Tobacco Use: Never    Passive Exposure: Not on file  Financial Resource Strain: Low Risk (05/20/2024)   Overall Financial Resource Strain (CARDIA)    Difficulty of Paying Living Expenses: Not hard at all  Food Insecurity: No Food Insecurity (05/20/2024)   Hunger Vital Sign    Worried About Running Out of Food in the Last Year: Never true    Ran Out of Food in the Last Year: Never true  Transportation Needs: No Transportation Needs (05/20/2024)   PRAPARE - Administrator, Civil Service (Medical): No    Lack of Transportation (Non-Medical): No  Physical Activity: Insufficiently Active (05/20/2024)   Exercise Vital Sign    Days of Exercise per Week: 1 day    Minutes of Exercise per Session: 50 min  Stress: No Stress Concern Present (05/20/2024)   [employer] of Occupational Health - Occupational Stress Questionnaire    Feeling of Stress : Not at all  Social Connections: Moderately Integrated (05/20/2024)   Social Connection and Isolation Panel    Frequency of Communication with Friends and Family: More than three times a week    Frequency of Social Gatherings with Friends and  Family: More than three times a week    Attends Religious Services: More than 4 times per year    Active Member of Clubs or Organizations: No    Attends Banker Meetings: Never    Marital Status: Married  Depression (PHQ2-9): Low Risk (12/09/2024)   Depression (PHQ2-9)    PHQ-2 Score: 1  Alcohol Screen: Low Risk (05/20/2024)   Alcohol Screen    Last Alcohol Screening Score (AUDIT): 2  Housing: Low Risk (05/20/2024)   Housing Stability Vital Sign    Unable to Pay for Housing in the Last Year: No    Number of Times Moved in the Last Year: 0    Homeless in the Last Year: No  Utilities: Not At Risk (05/20/2024)   AHC Utilities    Threatened with loss of utilities: No  Health Literacy: Adequate Health Literacy (05/20/2024)    B1300 Health Literacy    Frequency of need for help with medical instructions: Never    Family History  Problem Relation Age of Onset   Cancer Father    Prostate cancer Father    Colon cancer Father     Health Maintenance  Topic Date Due   Colonoscopy  05/18/2024   COVID-19 Vaccine (6 - 2025-26 season) 08/18/2024   Influenza Vaccine  03/17/2025 (Originally 07/18/2024)   Medicare Annual Wellness (AWV)  05/20/2025   DTaP/Tdap/Td (4 - Td or Tdap) 12/13/2032   Pneumococcal Vaccine: 50+ Years  Completed   Zoster Vaccines- Shingrix  Completed   Meningococcal B Vaccine  Aged Out     ----------------------------------------------------------------------------------------------------------------------------------------------------------------------------------------------------------------- Physical Exam BP (!) 167/80 (BP Location: Left Arm, Patient Position: Sitting, Cuff Size: Normal)   Pulse 73   Temp (!) 97.5 F (36.4 C) (Oral)   Ht 5' 5 (1.651 m)   Wt 207 lb (93.9 kg)   SpO2 96%   BMI 34.45 kg/m   Physical Exam Constitutional:      Appearance: Normal appearance.  HENT:     Head: Normocephalic and atraumatic.  Eyes:     General: No scleral icterus.    Comments: Subconjunctival bleeding noted. PERRL.    Musculoskeletal:     Cervical back: Neck supple.  Neurological:     General: No focal deficit present.     Mental Status: He is alert.  Psychiatric:        Mood and Affect: Mood normal.        Behavior: Behavior normal.     ------------------------------------------------------------------------------------------------------------------------------------------------------------------------------------------------------------------- Assessment and Plan  Subconjunctival hemorrhage Normal visual acuity.  Discussed conservative care.  Handout given.  Red flags reviewed   No orders of the defined types were placed in this encounter.   No follow-ups on  file.        [1] No Known Allergies  "

## 2024-12-09 NOTE — Patient Instructions (Signed)
 Broken Blood Vessels in the Eye (Subconjunctival Hemorrhage): What to Know A subconjunctival hemorrhage happens when tiny blood vessels near the surface of your eye break and bleed. This creates a red patch on the white part of your eye. The redness may cover the entire white part of your eye or a small area. This is similar to a bruise. Although it might look bad, this injury is often harmless and doesn't affect your eyesight or cause pain. It often goes away on its own within 2-4 weeks. What are the causes? A subconjunctival hemorrhage may be caused by: Mild trauma, such as rubbing your eye too hard. Blunt injuries, such as ones that can happen during sports. Coughing, sneezing, or throwing up. Straining, such as when lifting a heavy object. Medical problems, such as: High blood pressure. Bleeding disorders. Diabetes. Recent eye surgery. Some medicines, especially blood thinners. This includes aspirin. Sometimes, this can happen without a clear cause. What are the signs or symptoms?  A bright or dark red patch on the white part of the eye. Swelling around the eye, if there's a lot of bleeding. Mild eye irritation. How is this diagnosed? Your health care provider may diagnose you based on your symptoms and a physical exam. If your injury was caused by trauma, your provider may have you see an eye specialist called an ophthalmologist. Other tests may include: An eye exam, including a vision test. A blood pressure check. Blood tests to check for bleeding disorders. X-rays or a CT scan to check for other injuries. How is this treated? Treatment isn't usually needed. If you have discomfort, your provider may recommend eye drops or putting cold, wet cloths on the affected eye. Follow these instructions at home: Take your medicines only as told. Use eye drops or apply a cold, wet cloth to help with discomfort as told. Avoid activities or places that bother or could injure your  eye. Contact a health care provider if: You have pain in your eye. The bleeding doesn't go away within 4 weeks. You keep getting new subconjunctival hemorrhages. Get help right away if: Your vision changes or you have double vision. You're suddenly sensitive to light. You have a very bad headache or keep throwing up. You're confused or more tired than normal. Your eye seems swollen or sticks out. You have unexplained bruises or bleeding in another area of your body. These symptoms may be an emergency. Call 911 right away. Do not wait to see if the symptoms will go away. Do not drive yourself to the hospital. This information is not intended to replace advice given to you by your health care provider. Make sure you discuss any questions you have with your health care provider. Document Revised: 07/30/2023 Document Reviewed: 07/30/2023 Elsevier Patient Education  2024 [employer].

## 2024-12-09 NOTE — Assessment & Plan Note (Signed)
 Normal visual acuity.  Discussed conservative care.  Handout given.  Red flags reviewed

## 2024-12-16 ENCOUNTER — Ambulatory Visit (INDEPENDENT_AMBULATORY_CARE_PROVIDER_SITE_OTHER)

## 2024-12-16 VITALS — Temp 98.0°F

## 2024-12-16 DIAGNOSIS — Z23 Encounter for immunization: Secondary | ICD-10-CM | POA: Diagnosis not present

## 2024-12-24 ENCOUNTER — Encounter: Payer: Self-pay | Admitting: Dermatology

## 2024-12-24 ENCOUNTER — Ambulatory Visit (INDEPENDENT_AMBULATORY_CARE_PROVIDER_SITE_OTHER): Admitting: Dermatology

## 2024-12-24 DIAGNOSIS — Z1283 Encounter for screening for malignant neoplasm of skin: Secondary | ICD-10-CM | POA: Diagnosis not present

## 2024-12-24 DIAGNOSIS — W908XXA Exposure to other nonionizing radiation, initial encounter: Secondary | ICD-10-CM

## 2024-12-24 DIAGNOSIS — D489 Neoplasm of uncertain behavior, unspecified: Secondary | ICD-10-CM | POA: Diagnosis not present

## 2024-12-24 DIAGNOSIS — L821 Other seborrheic keratosis: Secondary | ICD-10-CM | POA: Diagnosis not present

## 2024-12-24 DIAGNOSIS — L578 Other skin changes due to chronic exposure to nonionizing radiation: Secondary | ICD-10-CM

## 2024-12-24 DIAGNOSIS — C44719 Basal cell carcinoma of skin of left lower limb, including hip: Secondary | ICD-10-CM

## 2024-12-24 DIAGNOSIS — L814 Other melanin hyperpigmentation: Secondary | ICD-10-CM | POA: Diagnosis not present

## 2024-12-24 DIAGNOSIS — D229 Melanocytic nevi, unspecified: Secondary | ICD-10-CM

## 2024-12-24 DIAGNOSIS — D1801 Hemangioma of skin and subcutaneous tissue: Secondary | ICD-10-CM

## 2024-12-24 NOTE — Patient Instructions (Addendum)

## 2024-12-24 NOTE — Progress Notes (Addendum)
 "  New Patient Visit  Patient (and/or pt guardian) consented to the use of AI-assisted tools for note generation.    Subjective  Brian Donovan is a 84 y.o. male who presents for the following:  Total Body Skin Exam (TBSE)  Patient present today for new patient visit for TBSE. The patient reports he  has spots, moles and lesions to be evaluated, some may be new or changing and the patient may have concern these could be cancer.  Patient has not previously been treated by a dermatologist. Patient reports he  does not have hx of bx. Patient denies family history of skin cancers. Patient reports throughout his lifetime has had moderated to complete sun exposure.  Currently, patient reports if he  has excessive sun exposure, he  does not apply sunscreen and/or wears protective coverings.  The following portions of the chart were reviewed this encounter and updated as appropriate: medications, allergies, medical history  Review of Systems:  No other skin or systemic complaints except as noted in HPI or Assessment and Plan.  Objective  Well appearing patient in no apparent distress; mood and affect are within normal limits.  A full examination was performed including scalp, head, eyes, ears, nose, lips, neck, chest, axillae, abdomen, back, buttocks, bilateral upper extremities, bilateral lower extremities, hands, feet, fingers, toes, fingernails, and toenails. All findings within normal limits unless otherwise noted below.   Relevant exam findings are noted in the Assessment and Plan.      Left Lateral Lower Leg 8 mm crusted papule    Assessment & Plan   LENTIGINES, SEBORRHEIC KERATOSES, HEMANGIOMAS - Benign normal skin lesions - Benign-appearing - Call for any changes  MELANOCYTIC NEVI - Tan-brown and/or pink-flesh-colored symmetric macules and papules - Benign appearing on exam today - Observation - Call clinic for new or changing moles - Recommend daily use of broad spectrum  spf 30+ sunscreen to sun-exposed areas.   ACTINIC DAMAGE - Chronic condition, secondary to cumulative UV/sun exposure - diffuse scaly erythematous macules with underlying dyspigmentation - Recommend daily broad spectrum sunscreen SPF 30+ to sun-exposed areas, reapply every 2 hours as needed.  - Staying in the shade or wearing long sleeves, sun glasses (UVA+UVB protection) and wide brim hats (4-inch brim around the entire circumference of the hat) are also recommended for sun protection.  - Call for new or changing lesions.  SKIN CANCER SCREENING PERFORMED TODAY  SEBORRHEIC DERMATITIS Exam: Pink patches with greasy scale at scalp  Seborrheic Dermatitis is a chronic persistent rash characterized by pinkness and scaling most commonly of the mid face but also can occur on the scalp (dandruff), ears; mid chest, mid back and groin.  It tends to be exacerbated by stress and cooler weather.  People who have neurologic disease may experience new onset or exacerbation of existing seborrheic dermatitis.  The condition is not curable but treatable and can be controlled.  Treatment Plan: Advised to use [employer] Zinc shampoo as needed   NEOPLASM OF UNCERTAIN BEHAVIOR Left Lateral Lower Leg - Epidermal / dermal shaving  Lesion diameter (cm):  0.8 Informed consent: discussed and consent obtained   Timeout: patient name, date of birth, surgical site, and procedure verified   Procedure prep:  Patient was prepped and draped in usual sterile fashion Prep type:  Isopropyl alcohol Anesthesia: the lesion was anesthetized in a standard fashion   Anesthetic:  1% lidocaine w/ epinephrine 1-100,000 local infiltration Instrument used: DermaBlade   Hemostasis achieved with: aluminum chloride   Outcome:  patient tolerated procedure well   Post-procedure details: sterile dressing applied and wound care instructions given   Dressing type: petrolatum and bandage    Specimen A - Surgical pathology Differential  Diagnosis: r/o DN vs Pigmented BCC  Check Margins: No IRRITATED SEBORRHEIC KERATOSIS (2) Head - Anterior (Face), Neck - Lateral Anterior - Destruction of lesion - Neck - Lateral Anterior Complexity: simple   Destruction method: cryotherapy   Informed consent: discussed and consent obtained   Timeout:  patient name, date of birth, surgical site, and procedure verified Cryotherapy cycles:  4 Outcome: patient tolerated procedure well with no complications   Post-procedure details: wound care instructions given    SKIN EXAM FOR MALIGNANT NEOPLASM   MULTIPLE BENIGN MELANOCYTIC NEVI   CHERRY ANGIOMA   LENTIGINES   ACTINIC SKIN DAMAGE   SEBORRHEIC KERATOSIS    Return in about 1 year (around 12/24/2025) for FBSE F/U.  [name], am acting as a neurosurgeon for [employer], DO .   Documentation: I have reviewed the above documentation for accuracy and completeness, and I agree with the above.  [name], DO       "

## 2024-12-24 NOTE — Progress Notes (Signed)
" ° °  New Patient Visit  Patient (and/or pt guardian) consented to the use of AI-assisted tools for note generation.    Subjective  Brian Donovan is a 84 y.o. male who presents for the following:  Total Body Skin Exam (TBSE)  Patient present today for new patient visit for TBSE. The patient reports he  has spots, moles and lesions to be evaluated, some may be new or changing and the patient may have concern these could be cancer.  Patient has not previously been treated by a dermatologist. Patient reports he  does not have hx of bx. Patient denies family history of skin cancers. Patient reports throughout his lifetime has had moderated to complete sun exposure.  Currently, patient reports if he  has excessive sun exposure, he  does not apply sunscreen and/or wears protective coverings.  The following portions of the chart were reviewed this encounter and updated as appropriate: medications, allergies, medical history  Review of Systems:  No other skin or systemic complaints except as noted in HPI or Assessment and Plan.  Objective  Well appearing patient in no apparent distress; mood and affect are within normal limits.  A full examination was performed including scalp, head, eyes, ears, nose, lips, neck, chest, axillae, abdomen, back, buttocks, bilateral upper extremities, bilateral lower extremities, hands, feet, fingers, toes, fingernails, and toenails. All findings within normal limits unless otherwise noted below.     Relevant exam findings are noted in the Assessment and Plan. Left Lateral Lower Leg 8 mm crusted papule   Assessment & Plan   LENTIGINES, SEBORRHEIC KERATOSES, HEMANGIOMAS - Benign normal skin lesions - Benign-appearing - Call for any changes  MELANOCYTIC NEVI - Tan-brown and/or pink-flesh-colored symmetric macules and papules - Benign appearing on exam today - Observation - Call clinic for new or changing moles - Recommend daily use of broad spectrum spf  30+ sunscreen to sun-exposed areas.   ACTINIC DAMAGE - Chronic condition, secondary to cumulative UV/sun exposure - diffuse scaly erythematous macules with underlying dyspigmentation - Recommend daily broad spectrum sunscreen SPF 30+ to sun-exposed areas, reapply every 2 hours as needed.  - Staying in the shade or wearing long sleeves, sun glasses (UVA+UVB protection) and wide brim hats (4-inch brim around the entire circumference of the hat) are also recommended for sun protection.  - Call for new or changing lesions.  SKIN CANCER SCREENING PERFORMED TODAY  SEBORRHEIC DERMATITIS Exam: Pink patches with greasy scale at scalp  Seborrheic Dermatitis is a chronic persistent rash characterized by pinkness and scaling most commonly of the mid face but also can occur on the scalp (dandruff), ears; mid chest, mid back and groin.  It tends to be exacerbated by stress and cooler weather.  People who have neurologic disease may experience new onset or exacerbation of existing seborrheic dermatitis.  The condition is not curable but treatable and can be controlled.  Treatment Plan: Advised to use [employer] Zinc shampoo as needed    NEOPLASM OF UNCERTAIN BEHAVIOR Left Lateral Lower Leg - Epidermal / dermal shaving IRRITATED SEBORRHEIC KERATOSIS (2) Head - Anterior (Face), Neck - Lateral Anterior - Destruction of lesion - Neck - Lateral Anterior  Return in about 1 year (around 12/24/2025) for FBSE F/U.  Brian Donovan Brian Donovan, am acting as a neurosurgeon for [employer], DO .   Documentation: I have reviewed the above documentation for accuracy and completeness, and I agree with the above.  [name], DO       "

## 2024-12-25 LAB — SURGICAL PATHOLOGY

## 2024-12-30 ENCOUNTER — Ambulatory Visit: Payer: Self-pay | Admitting: Dermatology

## 2024-12-30 DIAGNOSIS — C4491 Basal cell carcinoma of skin, unspecified: Secondary | ICD-10-CM | POA: Insufficient documentation

## 2024-12-30 NOTE — Progress Notes (Signed)
 Hi Shirron,  Please refer to Dr. Paci for a surgical excision.  Thanks      1. Skin, left lateral lower leg :       BASAL CELL CARCINOMA, NODULAR PATTERN

## 2025-01-06 ENCOUNTER — Encounter: Payer: Self-pay | Admitting: Family Medicine

## 2025-01-22 ENCOUNTER — Encounter: Payer: Self-pay | Admitting: Family Medicine

## 2025-01-22 ENCOUNTER — Encounter: Payer: Self-pay | Admitting: Dermatology

## 2025-01-22 NOTE — Telephone Encounter (Signed)
 Spoke with pharmacy -  [facility]  ER 25mg  is too expnesive $85 Asked if ran as WILHEMINA would be cheaper? She states it does show that WILHEMINA is preferred but could not find out cost until ran again for fill States due to fill again on 01/25/2025 Spoke with patient and will call on Monday 01/26/2025 and have them run as BRAND to find out cost for patient. He states he does have enough of medication currently and would appreciate us  doing this.   Requested a call after done with cost and be left on voicemail.

## 2025-01-22 NOTE — Telephone Encounter (Signed)
 Please keep in my In basket

## 2025-01-23 ENCOUNTER — Other Ambulatory Visit (HOSPITAL_COMMUNITY): Payer: Self-pay

## 2025-01-26 ENCOUNTER — Encounter: Admitting: Dermatology

## 2025-03-09 ENCOUNTER — Encounter: Admitting: Dermatology

## 2025-05-21 ENCOUNTER — Ambulatory Visit
